# Patient Record
Sex: Female | Born: 2000 | Race: Black or African American | Hispanic: No | Marital: Single | Smoking: Never smoker
Health system: Southern US, Community
[De-identification: ages and names within clinical notes are randomized; demographics above are authoritative.]

## PROBLEM LIST (undated history)

## (undated) ENCOUNTER — Inpatient Hospital Stay (HOSPITAL_COMMUNITY): Payer: Self-pay

## (undated) DIAGNOSIS — Z9109 Other allergy status, other than to drugs and biological substances: Secondary | ICD-10-CM

## (undated) DIAGNOSIS — Z8759 Personal history of other complications of pregnancy, childbirth and the puerperium: Secondary | ICD-10-CM

## (undated) DIAGNOSIS — Z8619 Personal history of other infectious and parasitic diseases: Secondary | ICD-10-CM

## (undated) HISTORY — PX: NO PAST SURGERIES: SHX2092

---
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
## 2021-03-02 ENCOUNTER — Other Ambulatory Visit: Payer: Self-pay

## 2021-03-02 ENCOUNTER — Ambulatory Visit
Admission: EM | Admit: 2021-03-02 | Discharge: 2021-03-02 | Disposition: A | Discharge status: Home / Self Care | Payer: Medicaid Other | Attending: Emergency Medicine | Admitting: Emergency Medicine

## 2021-03-02 ENCOUNTER — Encounter: Payer: Self-pay | Admitting: Emergency Medicine

## 2021-03-02 DIAGNOSIS — J029 Acute pharyngitis, unspecified: Secondary | ICD-10-CM | POA: Diagnosis not present

## 2021-03-02 LAB — POCT RAPID STREP A (OFFICE): Rapid Strep A Screen: NEGATIVE

## 2021-03-02 NOTE — Discharge Instructions (Addendum)
Your rapid strep test is negative.    Take Tylenol or ibuprofen as needed for discomfort.    Follow-up with your primary care provider if your symptoms are not improving.       

## 2021-03-02 NOTE — ED Provider Notes (Signed)
Dorothy Wallace    CSN: [id number] Arrival date & time: 03/02/21  1331      History   Chief Complaint Chief Complaint  Patient presents with   Sore Throat    HPI Dorothy Wallace is a 20 y.o. female.  Patient presents with 62-month history of intermittent sore throat with hoarse voice.  She denies fever, chills, rash, cough, shortness of breath, or other symptoms.  No treatments attempted at home.  Her medical history includes seasonal allergies.  The history is provided by the patient and medical records.   Past Medical History:  Diagnosis Date   Environmental allergies    History of chlamydia    History of gonorrhea    History of miscarriage     There are no problems to display for this patient.   History reviewed. No pertinent surgical history.  OB History   No obstetric history on file.      Home Medications    Prior to Admission medications   Medication Sig Start Date End Date Taking? Authorizing Provider  metroNIDAZOLE (FLAGYL) 500 MG tablet Take 1 tablet (500 mg total) by mouth 2 (two) times daily. 09/29/20   Becky Augusta, NP    Family History Family History  Problem Relation Age of Onset   Stroke Mother    Multiple sclerosis Father     Social History Social History   Tobacco Use   Smoking status: Never   Smokeless tobacco: Never  Vaping Use   Vaping Use: Never used  Substance Use Topics   Alcohol use: Never   Drug use: Never     Allergies   Pollen extract   Review of Systems Review of Systems  Constitutional:  Negative for chills and fever.  HENT:  Positive for sore throat and voice change. Negative for ear pain and trouble swallowing.   Respiratory:  Negative for cough and shortness of breath.   Cardiovascular:  Negative for chest pain and palpitations.  Skin:  Negative for color change and rash.  All other systems reviewed and are negative.   Physical Exam Triage Vital Signs ED Triage Vitals  Enc Vitals Group     BP       Pulse      Resp      Temp      Temp src      SpO2      Weight      Height      Head Circumference      Peak Flow      Pain Score      Pain Loc      Pain Edu?      Excl. in GC?    No data found.  Updated Vital Signs BP 114/80   Pulse 75   Temp 98.5 F (36.9 C)   Resp 20   SpO2 99%   Visual Acuity Right Eye Distance:   Left Eye Distance:   Bilateral Distance:    Right Eye Near:   Left Eye Near:    Bilateral Near:     Physical Exam Vitals and nursing note reviewed.  Constitutional:      General: She is not in acute distress.    Appearance: She is well-developed. She is not ill-appearing.  HENT:     Head: Normocephalic and atraumatic.     Right Ear: Tympanic membrane normal.     Left Ear: Tympanic membrane normal.     Nose: Nose normal.     Mouth/Throat:  Mouth: Mucous membranes are moist.     Pharynx: Posterior oropharyngeal erythema present.  Eyes:     Conjunctiva/sclera: Conjunctivae normal.  Cardiovascular:     Rate and Rhythm: Normal rate and regular rhythm.     Heart sounds: Normal heart sounds.  Pulmonary:     Effort: Pulmonary effort is normal. No respiratory distress.     Breath sounds: Normal breath sounds.  Abdominal:     Palpations: Abdomen is soft.     Tenderness: There is no abdominal tenderness.  Musculoskeletal:     Cervical back: Neck supple.  Skin:    General: Skin is warm and dry.  Neurological:     General: No focal deficit present.     Mental Status: She is alert and oriented to person, place, and time.     Gait: Gait normal.  Psychiatric:        Mood and Affect: Mood normal.        Behavior: Behavior normal.     UC Treatments / Results  Labs (all labs ordered are listed, but only abnormal results are displayed) Labs Reviewed  POCT RAPID STREP A (OFFICE) - Normal    EKG   Radiology No results found.  Procedures Procedures (including critical care time)  Medications Ordered in UC Medications - No data to  display  Initial Impression / Assessment and Plan / UC Course  I have reviewed the triage vital signs and the nursing notes.  Pertinent labs & imaging results that were available during my care of the patient were reviewed by me and considered in my medical decision making (see chart for details).  Sore throat.  Rapid strep negative.  Symptomatic treatment.  Instructed patient to follow-up with her PCP if her symptoms are not improving.  Education provided on sore throats.  Patient agrees to plan of care.   Final Clinical Impressions(s) / UC Diagnoses   Final diagnoses:  Sore throat     Discharge Instructions      Your rapid strep test is negative.    Take Tylenol or ibuprofen as needed for discomfort.    Follow up with your primary care provider if your symptoms are not improving.         ED Prescriptions   None    PDMP not reviewed this encounter.   Mickie Bail, NP 03/02/21 1408

## 2021-03-02 NOTE — ED Triage Notes (Signed)
Pt here with sore throat and bloody sputum that has been on and off for many weeks.

## 2021-04-07 ENCOUNTER — Other Ambulatory Visit: Payer: Self-pay

## 2021-04-07 ENCOUNTER — Ambulatory Visit
Admission: EM | Admit: 2021-04-07 | Discharge: 2021-04-07 | Disposition: A | Discharge status: Home / Self Care | Payer: Medicaid Other | Attending: Internal Medicine | Admitting: Internal Medicine

## 2021-04-07 DIAGNOSIS — N760 Acute vaginitis: Secondary | ICD-10-CM | POA: Insufficient documentation

## 2021-04-07 LAB — WET PREP, GENITAL
Sperm: NONE SEEN
Trich, Wet Prep: NONE SEEN
WBC, Wet Prep HPF POC: NONE SEEN
Yeast Wet Prep HPF POC: NONE SEEN

## 2021-04-07 LAB — CHLAMYDIA/NGC RT PCR (ARMC ONLY)
Chlamydia Tr: NOT DETECTED
N gonorrhoeae: NOT DETECTED

## 2021-04-07 MED ORDER — FLUCONAZOLE 150 MG PO TABS: 150.0000 mg | ORAL_TABLET | Freq: Once | ORAL | 0 refills | Status: AC

## 2021-04-07 NOTE — Discharge Instructions (Addendum)
Avoid sexual intercourse until the lab results are available We will call you with recommendations if labs are abnormal Take medications as prescribed Return to urgent care if symptoms persist.

## 2021-04-07 NOTE — ED Triage Notes (Signed)
Pt c/o of excessive discharge that is white and thick, slight smell x 1 week.  Denies itching.

## 2021-04-07 NOTE — ED Provider Notes (Signed)
[facility]    CSN: [id number] Arrival date & time: 04/07/21  1055      History   Chief Complaint Chief Complaint  Patient presents with   Vaginal Discharge    HPI Dorothy Wallace is a 20 y.o. female comes to the urgent care with a 1 week history of thick white vaginal discharge with mild odor.  Symptoms started insidiously and has been persistent.  She denies any dysuria urgency or frequency.  No abdominal pain.  No fever or chills.  No deep dyspareunia.  Patient denies douching. HPI  Past Medical History:  Diagnosis Date   Environmental allergies    History of chlamydia    History of gonorrhea    History of miscarriage     There are no problems to display for this patient.   No past surgical history on file.  OB History   No obstetric history on file.      Home Medications    Prior to Admission medications   Medication Sig Start Date End Date Taking? Authorizing Provider  fluconazole (DIFLUCAN) 150 MG tablet Take 1 tablet (150 mg total) by mouth once for 1 dose. 04/07/21 04/07/21 Yes Caddie Randle, Britta Mccreedy, MD  metroNIDAZOLE (FLAGYL) 500 MG tablet Take 1 tablet (500 mg total) by mouth 2 (two) times daily. 09/29/20   Becky Augusta, NP    Family History Family History  Problem Relation Age of Onset   Stroke Mother    Multiple sclerosis Father     Social History Social History   Tobacco Use   Smoking status: Never   Smokeless tobacco: Never  Vaping Use   Vaping Use: Never used  Substance Use Topics   Alcohol use: Never   Drug use: Never     Allergies   Pollen extract   Review of Systems Review of Systems  Constitutional: Negative.   Gastrointestinal:  Negative for abdominal pain.  Genitourinary:  Positive for vaginal discharge. Negative for dysuria, frequency, urgency, vaginal bleeding and vaginal pain.    Physical Exam Triage Vital Signs ED Triage Vitals  Enc Vitals Group     BP 04/07/21 1126 102/76     Pulse Rate 04/07/21 1126  (!) 123     Resp 04/07/21 1126 16     Temp 04/07/21 1126 98.4 F (36.9 C)     Temp Source 04/07/21 1126 Oral     SpO2 04/07/21 1126 100 %     Weight 04/07/21 1124 230 lb (104.3 kg)     Height 04/07/21 1124 5\' 8"  (1.727 m)     Head Circumference --      Peak Flow --      Pain Score --      Pain Loc --      Pain Edu? --      Excl. in GC? --    No data found.  Updated Vital Signs BP 102/76 (BP Location: Left Arm)   Pulse (!) 123   Temp 98.4 F (36.9 C) (Oral)   Resp 16   Ht 5\' 8"  (1.727 m)   Wt 104.3 kg   LMP 03/16/2021 (Exact Date)   SpO2 100%   BMI 34.97 kg/m   Visual Acuity Right Eye Distance:   Left Eye Distance:   Bilateral Distance:    Right Eye Near:   Left Eye Near:    Bilateral Near:     Physical Exam Vitals and nursing note reviewed.  Cardiovascular:     Rate and Rhythm: Normal rate  and regular rhythm.  Pulmonary:     Effort: Pulmonary effort is normal.     Breath sounds: Normal breath sounds.  Abdominal:     General: Bowel sounds are normal.     Palpations: Abdomen is soft.  Neurological:     Mental Status: She is alert.     UC Treatments / Results  Labs (all labs ordered are listed, but only abnormal results are displayed) Labs Reviewed  CERVICOVAGINAL ANCILLARY ONLY    EKG   Radiology No results found.  Procedures Procedures (including critical care time)  Medications Ordered in UC Medications - No data to display  Initial Impression / Assessment and Plan / UC Course  I have reviewed the triage vital signs and the nursing notes.  Pertinent labs & imaging results that were available during my care of the patient were reviewed by me and considered in my medical decision making (see chart for details).     1.  Acute vaginitis likely vaginal yeast infection: Wet prep GC/chlamydia testing Fluconazole 150 mg x 2 doses We will call patient with recommendations if labs are abnormal Return to urgent care if symptoms persist. Avoid  sexual intercourse until lab results are available.  Final Clinical Impressions(s) / UC Diagnoses   Final diagnoses:  Acute vaginitis   Discharge Instructions   None    ED Prescriptions     Medication Sig Dispense Auth. Provider   fluconazole (DIFLUCAN) 150 MG tablet Take 1 tablet (150 mg total) by mouth once for 1 dose. 2 tablet Kateland Leisinger, Britta Mccreedy, MD      PDMP not reviewed this encounter.   Merrilee Jansky, MD 04/07/21 [id number]

## 2021-06-06 ENCOUNTER — Other Ambulatory Visit: Payer: Self-pay

## 2021-06-06 ENCOUNTER — Encounter (HOSPITAL_COMMUNITY): Payer: Self-pay | Admitting: Emergency Medicine

## 2021-06-06 ENCOUNTER — Ambulatory Visit (HOSPITAL_COMMUNITY)
Admission: EM | Admit: 2021-06-06 | Discharge: 2021-06-06 | Disposition: A | Discharge status: Home / Self Care | Payer: Medicaid Other | Attending: Sports Medicine | Admitting: Sports Medicine

## 2021-06-06 DIAGNOSIS — B950 Streptococcus, group A, as the cause of diseases classified elsewhere: Secondary | ICD-10-CM

## 2021-06-06 DIAGNOSIS — J020 Streptococcal pharyngitis: Secondary | ICD-10-CM | POA: Diagnosis not present

## 2021-06-06 LAB — POCT RAPID STREP A, ED / UC: Streptococcus, Group A Screen (Direct): POSITIVE — AB

## 2021-06-06 MED ORDER — PENICILLIN V POTASSIUM 500 MG PO TABS: 500.0000 mg | ORAL_TABLET | Freq: Two times a day (BID) | ORAL | 0 refills | Status: AC

## 2021-06-06 NOTE — ED Provider Notes (Signed)
[facility]    CSN: [id number] Arrival date & time: 06/06/21  1617      History   Chief Complaint Chief Complaint  Patient presents with   Sore Throat    HPI Dorothy Wallace is a 20 y.o. female here for sore throat.   Sore Throat Pertinent negatives include no headaches and no shortness of breath.   Sore throat x 4 days.  Temps at home: 101 > 100.6 F.  Meds: Is taking Tylenol BID at home for fevers and irritability. Nasal rhinorrhea Ear pain: No Painful to swallow. No ear pain, no headache. Reports some chest congestion that is mild, but her throat pain is the most significant discomfort. No allergy to penicillins. No known prior sick contacts.   CENTOR - cough: absent - tonsillar exudates: Yes - Nodes (lymphadenopathy ant cervical): Yes - Temp > 100.4 F: Yes at home- temp is 100 F here - Age: 20  Past Medical History:  Diagnosis Date   Environmental allergies    History of chlamydia    History of gonorrhea    History of miscarriage     There are no problems to display for this patient.   History reviewed. No pertinent surgical history.  OB History   No obstetric history on file.      Home Medications    Prior to Admission medications   Medication Sig Start Date End Date Taking? Authorizing Provider  penicillin v potassium (VEETID) 500 MG tablet Take 1 tablet (500 mg total) by mouth in the morning and at bedtime for 10 days. 06/06/21 06/16/21 Yes Madelyn Brunner, DO  metroNIDAZOLE (FLAGYL) 500 MG tablet Take 1 tablet (500 mg total) by mouth 2 (two) times daily. Patient not taking: Reported on 06/06/2021 09/29/20   Becky Augusta, NP    Family History Family History  Problem Relation Age of Onset   Stroke Mother    Multiple sclerosis Father     Social History Social History   Tobacco Use   Smoking status: Never   Smokeless tobacco: Never  Vaping Use   Vaping Use: Never used  Substance Use Topics   Alcohol use: Never   Drug use:  Never     Allergies   Pollen extract   Review of Systems Review of Systems  Constitutional:  Positive for appetite change, chills and fever.  HENT:  Positive for congestion, sore throat and trouble swallowing.   Eyes:  Negative for visual disturbance.  Respiratory:  Negative for cough, shortness of breath and wheezing.   Cardiovascular:        + upper chest/throat discomfort  Skin:  Negative for rash.  Neurological:  Negative for dizziness and headaches.    Physical Exam Triage Vital Signs ED Triage Vitals  Enc Vitals Group     BP 06/06/21 1700 124/73     Pulse Rate 06/06/21 1700 (!) 102     Resp 06/06/21 1700 (!) 22     Temp 06/06/21 1700 100 F (37.8 C)     Temp Source 06/06/21 1700 Oral     SpO2 06/06/21 1700 100 %     Weight --      Height --      Head Circumference --      Peak Flow --      Pain Score 06/06/21 1658 10     Pain Loc --      Pain Edu? --      Excl. in GC? --    No data  found.  Updated Vital Signs BP 124/73 (BP Location: Right Arm)    Pulse (!) 102    Temp 100 F (37.8 C) (Oral)    Resp (!) 22    LMP 05/24/2021    SpO2 100%    Physical Exam Constitutional:      General: She is not in acute distress.    Appearance: She is ill-appearing. She is not toxic-appearing.  HENT:     Head: Normocephalic and atraumatic.     Right Ear: Tympanic membrane and ear canal normal.     Left Ear: Tympanic membrane and ear canal normal.     Nose: No congestion.     Mouth/Throat:     Mouth: Mucous membranes are moist.     Pharynx: Posterior oropharyngeal erythema present.     Tonsils: Tonsillar exudate present. No tonsillar abscesses. 2+ on the right. 2+ on the left.  Eyes:     Conjunctiva/sclera: Conjunctivae normal.     Pupils: Pupils are equal, round, and reactive to light.  Cardiovascular:     Rate and Rhythm: Normal rate.     Heart sounds: Normal heart sounds.  Pulmonary:     Effort: Pulmonary effort is normal.     Breath sounds: Normal breath  sounds. No wheezing, rhonchi or rales.  Abdominal:     Palpations: Abdomen is soft.  Lymphadenopathy:     Cervical: Cervical adenopathy present.  Skin:    Capillary Refill: Capillary refill takes less than 2 seconds.  Neurological:     Mental Status: She is alert.  Psychiatric:        Mood and Affect: Mood normal.        Behavior: Behavior normal.     UC Treatments / Results  Labs (all labs ordered are listed, but only abnormal results are displayed) Labs Reviewed  POCT RAPID STREP A, ED / UC - Abnormal; Notable for the following components:      Result Value   Streptococcus, Group A Screen (Direct) POSITIVE (*)    All other components within normal limits    EKG   Radiology No results found.  Procedures Procedures (including critical care time)  Medications Ordered in UC Medications - No data to display  Initial Impression / Assessment and Plan / UC Course  I have reviewed the triage vital signs and the nursing notes.  Pertinent labs & imaging results that were available during my care of the patient were reviewed by me and considered in my medical decision making (see chart for details).     Group A strep pharyngitis -significant Centor criteria and in the setting of positive GAS testing in urgent care.  We will treat with penicillin V 500 mg twice daily x10 days.  Supportive treatment discussed for her throat pain.  Note provided for work to remain out over the next 48 hours until antibiotics kick in.  Return precautions provided.  Otherwise follow-up with PCP. Final Clinical Impressions(s) / UC Diagnoses   Final diagnoses:  Strep pharyngitis  Group A streptococcal infection   Discharge Instructions   None    ED Prescriptions     Medication Sig Dispense Auth. Provider   penicillin v potassium (VEETID) 500 MG tablet Take 1 tablet (500 mg total) by mouth in the morning and at bedtime for 10 days. 20 tablet Elba Barman, DO      PDMP not reviewed this  encounter.   Elba Barman, DO 06/06/21 1744

## 2021-06-06 NOTE — ED Triage Notes (Signed)
Sore throat for the last 4-5 days.  Very painful to eat.  Patient reports she has been running fevers intermittently

## 2021-06-06 NOTE — ED Notes (Signed)
Strep and flu swab in lab 

## 2021-07-08 DIAGNOSIS — N898 Other specified noninflammatory disorders of vagina: Secondary | ICD-10-CM

## 2021-07-08 DIAGNOSIS — N6452 Nipple discharge: Secondary | ICD-10-CM | POA: Insufficient documentation

## 2021-07-08 DIAGNOSIS — Z01419 Encounter for gynecological examination (general) (routine) without abnormal findings: Secondary | ICD-10-CM | POA: Insufficient documentation

## 2021-07-10 ENCOUNTER — Ambulatory Visit
Admission: EM | Admit: 2021-07-10 | Discharge: 2021-07-10 | Disposition: A | Discharge status: Home / Self Care | Payer: Medicaid Other | Attending: Family Medicine | Admitting: Family Medicine

## 2021-07-10 ENCOUNTER — Other Ambulatory Visit: Payer: Self-pay

## 2021-07-10 DIAGNOSIS — J029 Acute pharyngitis, unspecified: Secondary | ICD-10-CM | POA: Diagnosis not present

## 2021-07-10 DIAGNOSIS — J069 Acute upper respiratory infection, unspecified: Secondary | ICD-10-CM

## 2021-07-10 DIAGNOSIS — R6889 Other general symptoms and signs: Secondary | ICD-10-CM | POA: Diagnosis not present

## 2021-07-10 LAB — POCT RAPID STREP A (OFFICE): Rapid Strep A Screen: NEGATIVE

## 2021-07-10 LAB — POCT INFLUENZA A/B
Influenza A, POC: NEGATIVE
Influenza B, POC: NEGATIVE

## 2021-07-10 MED ORDER — AZITHROMYCIN 250 MG PO TABS: ORAL_TABLET | ORAL | 0 refills | Status: DC

## 2021-07-10 MED ORDER — PROMETHAZINE-DM 6.25-15 MG/5ML PO SYRP: 5.0000 mL | ORAL_SOLUTION | Freq: Three times a day (TID) | ORAL | 0 refills | Status: DC | PRN

## 2021-07-10 NOTE — ED Provider Notes (Signed)
[name]    CSN: [id number] Arrival date & time: 07/10/21  1112      History   Chief Complaint Chief Complaint  Patient presents with   Sore Throat    HPI Dorothy Wallace is a 21 y.o. female.   HPI Patient presents for evaluation of fever and sore throat x 3 days. She has been treating her symptoms with Dayquil, Nyquil and throat spray. She endorses nasal drainage although denies cough.  Past Medical History:  Diagnosis Date   Environmental allergies    History of chlamydia    History of gonorrhea    History of miscarriage     There are no problems to display for this patient.   History reviewed. No pertinent surgical history.  OB History   No obstetric history on file.      Home Medications    Prior to Admission medications   Medication Sig Start Date End Date Taking? Authorizing Provider  azithromycin (ZITHROMAX) 250 MG tablet Take 2 tabs PO x 1 dose, then 1 tab PO QD x 4 days 07/10/21  Yes Bing Neighbors, FNP  promethazine-dextromethorphan (PROMETHAZINE-DM) 6.25-15 MG/5ML syrup Take 5 mLs by mouth 3 (three) times daily as needed for cough. 07/10/21  Yes Bing Neighbors, FNP  metroNIDAZOLE (FLAGYL) 500 MG tablet Take 1 tablet (500 mg total) by mouth 2 (two) times daily. Patient not taking: Reported on 06/06/2021 09/29/20   Becky Augusta, NP    Family History Family History  Problem Relation Age of Onset   Stroke Mother    Multiple sclerosis Father     Social History Social History   Tobacco Use   Smoking status: Never   Smokeless tobacco: Never  Vaping Use   Vaping Use: Never used  Substance Use Topics   Alcohol use: Never   Drug use: Never     Allergies   Pollen extract   Review of Systems Review of Systems Pertinent negatives listed in HPI   Physical Exam Triage Vital Signs ED Triage Vitals  Enc Vitals Group     BP 07/10/21 1129 101/71     Pulse Rate 07/10/21 1129 (!) 117     Resp 07/10/21 1129 16     Temp  07/10/21 1129 100 F (37.8 C)     Temp Source 07/10/21 1129 Temporal     SpO2 07/10/21 1129 98 %     Weight --      Height --      Head Circumference --      Peak Flow --      Pain Score 07/10/21 1128 10     Pain Loc --      Pain Edu? --      Excl. in GC? --    No data found.  Updated Vital Signs BP 101/71 (BP Location: Left Arm)    Pulse (!) 117    Temp 100 F (37.8 C) (Temporal)    Resp 16    LMP 06/23/2021    SpO2 98%   Visual Acuity Right Eye Distance:   Left Eye Distance:   Bilateral Distance:    Right Eye Near:   Left Eye Near:    Bilateral Near:     Physical Exam Constitutional:      Appearance: She is well-developed.  HENT:     Head: Normocephalic and atraumatic.     Right Ear: Tympanic membrane normal.     Left Ear: Tympanic membrane normal.     Nose: Congestion  present.     Mouth/Throat:     Pharynx: Oropharyngeal exudate, posterior oropharyngeal erythema and uvula swelling present.     Tonsils: 3+ on the right. 3+ on the left.  Eyes:     Pupils: Pupils are equal, round, and reactive to light.  Cardiovascular:     Rate and Rhythm: Regular rhythm. Tachycardia present.  Musculoskeletal:     Cervical back: Normal range of motion.  Lymphadenopathy:     Cervical: Cervical adenopathy present.  Skin:    General: Skin is warm and dry.     Capillary Refill: Capillary refill takes less than 2 seconds.  Neurological:     General: No focal deficit present.     Mental Status: She is oriented to person, place, and time.  Psychiatric:        Behavior: Behavior is cooperative.      UC Treatments / Results  Labs (all labs ordered are listed, but only abnormal results are displayed) Labs Reviewed  POCT RAPID STREP A (OFFICE)  POCT INFLUENZA A/B    EKG   Radiology No results found.  Procedures Procedures (including critical care time)  Medications Ordered in UC Medications - No data to display  Initial Impression / Assessment and Plan / UC Course   I have reviewed the triage vital signs and the nursing notes.  Pertinent labs & imaging results that were available during my care of the patient were reviewed by me and considered in my medical decision making (see chart for details).    Rapid flu and strep , both negative. Treating for what appears to be a bacterial pharyngitis, mostly strep  appearance of throat. Treatment prescribed and recommendation per discharge medication orders. RTC PRN Final Clinical Impressions(s) / UC Diagnoses   Final diagnoses:  Acute pharyngitis, unspecified etiology  Viral URI with cough     Discharge Instructions      Rapid flu and rapid strep are both negative. Treating you for a suspected throat infection and remainder of your symptoms appear viral. Continue Tylenol and or Ibuprofen for fever and bodyaches, if symptoms worsen or do not improve return for evaluation     ED Prescriptions     Medication Sig Dispense Auth. Provider   azithromycin (ZITHROMAX) 250 MG tablet Take 2 tabs PO x 1 dose, then 1 tab PO QD x 4 days 6 tablet Bing Neighbors, FNP   promethazine-dextromethorphan (PROMETHAZINE-DM) 6.25-15 MG/5ML syrup Take 5 mLs by mouth 3 (three) times daily as needed for cough. 140 mL Bing Neighbors, FNP      PDMP not reviewed this encounter.   Bing Neighbors, [place] 07/13/21 [phone]

## 2021-07-10 NOTE — ED Triage Notes (Signed)
Patient presents to Urgent Care with complaints of fever and sore throat x 3 days. Treating symptoms with dayquil, throat spray, and halls.

## 2021-07-10 NOTE — Discharge Instructions (Addendum)
Rapid flu and rapid strep are both negative. Treating you for a suspected throat infection and remainder of your symptoms appear viral. Continue Tylenol and or Ibuprofen for fever and bodyaches, if symptoms worsen or do not improve return for evaluation

## 2021-07-15 ENCOUNTER — Other Ambulatory Visit: Payer: Self-pay

## 2021-07-15 ENCOUNTER — Ambulatory Visit
Admission: EM | Admit: 2021-07-15 | Discharge: 2021-07-15 | Disposition: A | Discharge status: Home / Self Care | Payer: Medicaid Other | Attending: Emergency Medicine | Admitting: Emergency Medicine

## 2021-07-15 ENCOUNTER — Encounter: Payer: Self-pay | Admitting: Emergency Medicine

## 2021-07-15 DIAGNOSIS — J029 Acute pharyngitis, unspecified: Secondary | ICD-10-CM | POA: Diagnosis not present

## 2021-07-15 MED ORDER — PREDNISONE 10 MG (21) PO TBPK: ORAL_TABLET | Freq: Every day | ORAL | 0 refills | Status: DC

## 2021-07-15 NOTE — ED Provider Notes (Signed)
[name]    CSN: [id number] Arrival date & time: 07/15/21  1535      History   Chief Complaint Chief Complaint  Patient presents with   Sore Throat    HPI Dorothy Wallace is a 21 y.o. female.  Patient presents with sore throat x 1 week.  She states she tested positive for chlamydia in her throat and was treated by her OB/GYN for this.  She contacted her OB/GYN today for ongoing sore throat and was instructed to go to the ED.  She denies fever, rash, or other symptoms.  Patient was seen at this urgent care on 07/10/2021; diagnosed with acute pharyngitis and viral URI with cough; treated with Zithromax and Promethazine DM.  She was seen by her OB/GYN on 07/08/2021 for vaginal discharge; positive for chlamydia; treated with metronidazole and 1 gram of azithromycin.  She was seen at this urgent care on 06/06/2021; diagnosed with strep pharyngitis; treated with penicillin V.    The history is provided by the patient and medical records.   Past Medical History:  Diagnosis Date   Environmental allergies    History of chlamydia    History of gonorrhea    History of miscarriage     There are no problems to display for this patient.   History reviewed. No pertinent surgical history.  OB History   No obstetric history on file.      Home Medications    Prior to Admission medications   Medication Sig Start Date End Date Taking? Authorizing Provider  predniSONE (STERAPRED UNI-PAK 21 TAB) 10 MG (21) TBPK tablet Take by mouth daily. As directed 07/15/21  Yes Mickie Bail, NP  azithromycin (ZITHROMAX) 250 MG tablet Take 2 tabs PO x 1 dose, then 1 tab PO QD x 4 days 07/10/21   Bing Neighbors, FNP  metroNIDAZOLE (FLAGYL) 500 MG tablet Take 1 tablet (500 mg total) by mouth 2 (two) times daily. Patient not taking: Reported on 06/06/2021 09/29/20   Becky Augusta, NP  promethazine-dextromethorphan (PROMETHAZINE-DM) 6.25-15 MG/5ML syrup Take 5 mLs by mouth 3 (three) times daily as  needed for cough. 07/10/21   Bing Neighbors, FNP    Family History Family History  Problem Relation Age of Onset   Stroke Mother    Multiple sclerosis Father     Social History Social History   Tobacco Use   Smoking status: Never   Smokeless tobacco: Never  Vaping Use   Vaping Use: Never used  Substance Use Topics   Alcohol use: Never   Drug use: Never     Allergies   Pollen extract   Review of Systems Review of Systems  Constitutional:  Negative for chills and fever.  HENT:  Positive for sore throat. Negative for ear pain.   Respiratory:  Negative for cough and shortness of breath.   Cardiovascular:  Negative for chest pain and palpitations.  Skin:  Negative for color change and rash.  All other systems reviewed and are negative.   Physical Exam Triage Vital Signs ED Triage Vitals  Enc Vitals Group     BP 07/15/21 1550 121/75     Pulse Rate 07/15/21 1550 (!) 105     Resp 07/15/21 1550 18     Temp 07/15/21 1550 97.9 F (36.6 C)     Temp Source 07/15/21 1550 Oral     SpO2 07/15/21 1550 99 %     Weight --      Height --  Head Circumference --      Peak Flow --      Pain Score 07/15/21 1553 8     Pain Loc --      Pain Edu? --      Excl. in GC? --    No data found.  Updated Vital Signs BP 121/75 (BP Location: Left Arm)    Pulse (!) 105    Temp 97.9 F (36.6 C) (Oral)    Resp 18    LMP 06/23/2021    SpO2 99%   Visual Acuity Right Eye Distance:   Left Eye Distance:   Bilateral Distance:    Right Eye Near:   Left Eye Near:    Bilateral Near:     Physical Exam Vitals and nursing note reviewed.  Constitutional:      General: She is not in acute distress.    Appearance: She is well-developed. She is not ill-appearing.  HENT:     Head: Normocephalic and atraumatic.     Right Ear: Tympanic membrane normal.     Left Ear: Tympanic membrane normal.     Nose: Nose normal.     Mouth/Throat:     Mouth: Mucous membranes are moist.     Pharynx:  Posterior oropharyngeal erythema present.     Tonsils: 1+ on the right. 1+ on the left.     Comments: Speech clear. No difficulty swallowing.  Cardiovascular:     Rate and Rhythm: Normal rate and regular rhythm.     Heart sounds: Normal heart sounds.  Pulmonary:     Effort: Pulmonary effort is normal. No respiratory distress.     Breath sounds: Normal breath sounds.  Musculoskeletal:     Cervical back: Neck supple.  Skin:    General: Skin is warm and dry.  Neurological:     Mental Status: She is alert.  Psychiatric:        Mood and Affect: Mood normal.        Behavior: Behavior normal.     UC Treatments / Results  Labs (all labs ordered are listed, but only abnormal results are displayed) Labs Reviewed - No data to display   EKG   Radiology No results found.  Procedures Procedures (including critical care time)  Medications Ordered in UC Medications - No data to display  Initial Impression / Assessment and Plan / UC Course  I have reviewed the triage vital signs and the nursing notes.  Pertinent labs & imaging results that were available during my care of the patient were reviewed by me and considered in my medical decision making (see chart for details).    Sore throat. Patient declines strep test. She declines STD testing of throat.  She was treated with Zithromax earlier this week.  Treating today with prednisone.  Discussed Tylenol or ibuprofen as needed. Instructed her to follow up with her PCP or an ENT if her symptoms are not improving.  She agrees to plan of care.   Final Clinical Impressions(s) / UC Diagnoses   Final diagnoses:  Sore throat     Discharge Instructions      Take the prednisone as directed.   Take Tylenol or ibuprofen as needed for discomfort.  Follow up with your primary care provider if your symptoms are not improving.         ED Prescriptions     Medication Sig Dispense Auth. Provider   predniSONE (STERAPRED UNI-PAK 21  TAB) 10 MG (21) TBPK tablet Take by mouth  daily. As directed 21 tablet Mickie Bail, NP      PDMP not reviewed this encounter.   Mickie Bail, NP 07/15/21 1627

## 2021-07-15 NOTE — ED Triage Notes (Signed)
Pt here with sore throat x 1 week. She is Chlamydia and consulted with her GYN today who stated she should be seen in person to be reevaluated.

## 2021-07-15 NOTE — Discharge Instructions (Addendum)
Take the prednisone as directed.    Take Tylenol or ibuprofen as needed for discomfort.    Follow up with your primary care provider if your symptoms are not improving.     

## 2021-08-03 ENCOUNTER — Ambulatory Visit
Admission: EM | Admit: 2021-08-03 | Discharge: 2021-08-03 | Disposition: A | Discharge status: Home / Self Care | Payer: Medicaid Other | Attending: Emergency Medicine | Admitting: Emergency Medicine

## 2021-08-03 ENCOUNTER — Encounter: Payer: Self-pay | Admitting: Emergency Medicine

## 2021-08-03 ENCOUNTER — Other Ambulatory Visit: Payer: Self-pay

## 2021-08-03 DIAGNOSIS — Z3A01 Less than 8 weeks gestation of pregnancy: Secondary | ICD-10-CM

## 2021-08-03 DIAGNOSIS — Z3201 Encounter for pregnancy test, result positive: Secondary | ICD-10-CM

## 2021-08-03 LAB — POCT URINE PREGNANCY: Preg Test, Ur: POSITIVE — AB

## 2021-08-03 NOTE — Discharge Instructions (Addendum)
Your pregnancy test is positive.  Based on your last menstrual cycle, you are approximately 5 or [redacted] weeks pregnant.  Start prenatal vitamins today.  Schedule an appointment with your obstetrician.

## 2021-08-03 NOTE — ED Triage Notes (Signed)
Pt presents for a pregnancy test LMP 06/23/2021

## 2021-08-03 NOTE — ED Provider Notes (Signed)
Roderic Palau    CSN: [id number] Arrival date & time: 08/03/21  [id number]      History   Chief Complaint Chief Complaint  Patient presents with   Possible Pregnancy    HPI Dorothy Wallace is a 21 y.o. female.  Patient presents with request for a pregnancy test.  LMP 06/23/2021.  She took a pregnancy test at home and it was positive.  She denies abdominal pain, dysuria, hematuria, vaginal discharge, pelvic pain, fever, or other symptoms.  Her medical history includes gonorrhea, chlamydia, miscarriage, environmental allergies.  The history is provided by the patient and medical records.   Past Medical History:  Diagnosis Date   Environmental allergies    History of chlamydia    History of gonorrhea    History of miscarriage     There are no problems to display for this patient.   History reviewed. No pertinent surgical history.  OB History   No obstetric history on file.      Home Medications    Prior to Admission medications   Medication Sig Start Date End Date Taking? Authorizing Provider  azithromycin (ZITHROMAX) 250 MG tablet Take 2 tabs PO x 1 dose, then 1 tab PO QD x 4 days 07/10/21   Scot Jun, FNP  metroNIDAZOLE (FLAGYL) 500 MG tablet Take 1 tablet (500 mg total) by mouth 2 (two) times daily. Patient not taking: Reported on 06/06/2021 09/29/20   Margarette Canada, NP  predniSONE (STERAPRED UNI-PAK 21 TAB) 10 MG (21) TBPK tablet Take by mouth daily. As directed 07/15/21   Sharion Balloon, NP  promethazine-dextromethorphan (PROMETHAZINE-DM) 6.25-15 MG/5ML syrup Take 5 mLs by mouth 3 (three) times daily as needed for cough. 07/10/21   Scot Jun, FNP    Family History Family History  Problem Relation Age of Onset   Stroke Mother    Multiple sclerosis Father     Social History Social History   Tobacco Use   Smoking status: Never   Smokeless tobacco: Never  Vaping Use   Vaping Use: Never used  Substance Use Topics   Alcohol use: Never   Drug  use: Never     Allergies   Pollen extract   Review of Systems Review of Systems  Constitutional:  Negative for chills and fever.  Respiratory:  Negative for cough and shortness of breath.   Cardiovascular:  Negative for chest pain and palpitations.  Gastrointestinal:  Negative for abdominal pain, nausea and vomiting.  Genitourinary:  Negative for dysuria, flank pain, hematuria, pelvic pain and vaginal discharge.  Skin:  Negative for color change and rash.  All other systems reviewed and are negative.   Physical Exam Triage Vital Signs ED Triage Vitals  Enc Vitals Group     BP 08/03/21 0936 124/79     Pulse Rate 08/03/21 0936 79     Resp 08/03/21 0936 18     Temp 08/03/21 0936 98.6 F (37 C)     Temp src --      SpO2 08/03/21 0936 99 %     Weight --      Height --      Head Circumference --      Peak Flow --      Pain Score 08/03/21 0927 0     Pain Loc --      Pain Edu? --      Excl. in [place]? --    No data found.  Updated Vital Signs BP 124/79  Pulse 79    Temp 98.6 F (37 C)    Resp 18    LMP 06/23/2021    SpO2 99%   Visual Acuity Right Eye Distance:   Left Eye Distance:   Bilateral Distance:    Right Eye Near:   Left Eye Near:    Bilateral Near:     Physical Exam Vitals and nursing note reviewed.  Constitutional:      General: She is not in acute distress.    Appearance: She is well-developed. She is not ill-appearing.  HENT:     Mouth/Throat:     Mouth: Mucous membranes are moist.  Cardiovascular:     Rate and Rhythm: Normal rate and regular rhythm.     Heart sounds: Normal heart sounds.  Pulmonary:     Effort: Pulmonary effort is normal. No respiratory distress.     Breath sounds: Normal breath sounds.  Abdominal:     Palpations: Abdomen is soft.     Tenderness: There is no abdominal tenderness. There is no right CVA tenderness, left CVA tenderness, guarding or rebound.  Musculoskeletal:     Cervical back: Neck supple.  Skin:    General:  Skin is warm and dry.  Neurological:     Mental Status: She is alert.  Psychiatric:        Mood and Affect: Mood normal.        Behavior: Behavior normal.     UC Treatments / Results  Labs (all labs ordered are listed, but only abnormal results are displayed) Labs Reviewed  POCT URINE PREGNANCY - Abnormal; Notable for the following components:      Result Value   Preg Test, Ur Positive (*)    All other components within normal limits    EKG   Radiology No results found.  Procedures Procedures (including critical care time)  Medications Ordered in UC Medications - No data to display  Initial Impression / Assessment and Plan / UC Course  I have reviewed the triage vital signs and the nursing notes.  Pertinent labs & imaging results that were available during my care of the patient were reviewed by me and considered in my medical decision making (see chart for details).   Positive pregnancy test, Less than [redacted] weeks pregnant.  LMP 06/23/2021.  Instructed patient to start prenatal vitamins today.  Instructed patient to schedule an appointment with her obstetrician.  Instructed her to avoid OTC medications unless instructed by her obstetrician.  Education provided on first trimester pregnancy.  Patient has already contacted her OB/GYN at [facility] for an appointment.  She agrees to plan of care.   Final Clinical Impressions(s) / UC Diagnoses   Final diagnoses:  Positive pregnancy test  Less than [redacted] weeks gestation of pregnancy     Discharge Instructions      Your pregnancy test is positive.  Based on your last menstrual cycle, you are approximately 5 or [redacted] weeks pregnant.  Start prenatal vitamins today.  Schedule an appointment with your obstetrician.        ED Prescriptions   None    PDMP not reviewed this encounter.   Sharion Balloon, NP 08/03/21 1008

## 2021-08-29 ENCOUNTER — Encounter: Payer: Self-pay | Admitting: Emergency Medicine

## 2021-08-29 ENCOUNTER — Emergency Department: Payer: Medicaid Other

## 2021-08-29 ENCOUNTER — Telehealth: Payer: Self-pay

## 2021-08-29 ENCOUNTER — Other Ambulatory Visit: Payer: Self-pay

## 2021-08-29 ENCOUNTER — Emergency Department
Admission: EM | Admit: 2021-08-29 | Discharge: 2021-08-29 | Disposition: A | Discharge status: Home / Self Care | Payer: Medicaid Other | Attending: Emergency Medicine | Admitting: Emergency Medicine

## 2021-08-29 DIAGNOSIS — O99111 Other diseases of the blood and blood-forming organs and certain disorders involving the immune mechanism complicating pregnancy, first trimester: Secondary | ICD-10-CM | POA: Insufficient documentation

## 2021-08-29 DIAGNOSIS — O26899 Other specified pregnancy related conditions, unspecified trimester: Secondary | ICD-10-CM

## 2021-08-29 DIAGNOSIS — Z6720 Type B blood, Rh positive: Secondary | ICD-10-CM | POA: Insufficient documentation

## 2021-08-29 DIAGNOSIS — O039 Complete or unspecified spontaneous abortion without complication: Secondary | ICD-10-CM | POA: Diagnosis not present

## 2021-08-29 DIAGNOSIS — D72819 Decreased white blood cell count, unspecified: Secondary | ICD-10-CM | POA: Diagnosis not present

## 2021-08-29 DIAGNOSIS — Z3A08 8 weeks gestation of pregnancy: Secondary | ICD-10-CM | POA: Insufficient documentation

## 2021-08-29 DIAGNOSIS — O99011 Anemia complicating pregnancy, first trimester: Secondary | ICD-10-CM | POA: Diagnosis not present

## 2021-08-29 DIAGNOSIS — O26891 Other specified pregnancy related conditions, first trimester: Secondary | ICD-10-CM | POA: Diagnosis present

## 2021-08-29 LAB — BASIC METABOLIC PANEL
Anion gap: 6 (ref 5–15)
BUN: 8 mg/dL (ref 6–20)
CO2: 22 mmol/L (ref 22–32)
Calcium: 9 mg/dL (ref 8.9–10.3)
Chloride: 106 mmol/L (ref 98–111)
Creatinine, Ser: 0.54 mg/dL (ref 0.44–1.00)
GFR, Estimated: >60 mL/min (ref >60)
Glucose, Bld: 87 mg/dL (ref 70–99)
Potassium: 3.6 mmol/L (ref 3.5–5.1)
Sodium: 134 mmol/L — ABNORMAL LOW (ref 135–145)

## 2021-08-29 LAB — CBC
HCT: 36.5 % (ref 36.0–46.0)
Hemoglobin: 11.7 g/dL — ABNORMAL LOW (ref 12.0–15.0)
MCH: 25.6 pg — ABNORMAL LOW (ref 26.0–34.0)
MCHC: 32.1 g/dL (ref 30.0–36.0)
MCV: 79.9 fL — ABNORMAL LOW (ref 80.0–100.0)
Platelets: 259 10*3/uL (ref 150–400)
RBC: 4.57 MIL/uL (ref 3.87–5.11)
RDW: 18.2 % — ABNORMAL HIGH (ref 11.5–15.5)
WBC: 3 10*3/uL — ABNORMAL LOW (ref 4.0–10.5)
nRBC: 0 % (ref 0.0–0.2)

## 2021-08-29 LAB — POC URINE PREG, ED: Preg Test, Ur: POSITIVE — AB

## 2021-08-29 LAB — HCG, QUANTITATIVE, PREGNANCY: hCG, Beta Chain, Quant, S: 26937 m[IU]/mL — ABNORMAL HIGH (ref <5)

## 2021-08-29 LAB — ABO/RH: ABO/RH(D): B POS

## 2021-08-29 NOTE — Discharge Instructions (Signed)
Follow-up with Dr. Cherylynn Ridges or Dr. Tiburcio Pea.  Please call them for an appointment.  He needs to see you within the next 48 hours.  Return emergency department worsening ?

## 2021-08-29 NOTE — ED Provider Notes (Signed)
? ?[facility] ?Provider Note ? ? ? Event Date/Time  ? First MD Initiated Contact with Patient 08/29/21 1149   ?  (approximate) ? ? ?History  ? ?Abdominal Cramping ? ? ?HPI ? ?Dorothy Wallace is a 21 y.o. female G2, P0 presents with complaints of abdominal cramping in pregnancy.  Patient thinks she is approximately [redacted] weeks pregnant.  Started having a large amount of cramping and some clear discharge.  Concerned as she has had a previous miscarriage.  No fever or chills.  No color to the discharge.  No bleeding. ? ?  ? ? ?Physical Exam  ? ?Triage Vital Signs: ?ED Triage Vitals  ?Enc Vitals Group  ?   BP 08/29/21 1125 119/79  ?   Pulse Rate 08/29/21 1125 85  ?   Resp 08/29/21 1125 16  ?   Temp 08/29/21 1125 98.2 ?F (36.8 ?C)  ?   Temp Source 08/29/21 1125 Oral  ?   SpO2 08/29/21 1125 100 %  ?   Weight 08/29/21 1126 217 lb (98.4 kg)  ?   Height 08/29/21 1126 5\' 8"  (1.727 m)  ?   Head Circumference --   ?   Peak Flow --   ?   Pain Score 08/29/21 1126 8  ?   Pain Loc --   ?   Pain Edu? --   ?   Excl. in [place]? --   ? ? ?Most recent vital signs: ?Vitals:  ? 08/29/21 1125 08/29/21 1318  ?BP: 119/79 120/70  ?Pulse: 85 80  ?Resp: 16 16  ?Temp: 98.2 ?F (36.8 ?C)   ?SpO2: 100% 100%  ? ? ? ?General: Awake, no distress.   ?CV:  Good peripheral perfusion. regular rate and  rhythm ?Resp:  Normal effort. Lungs CTA ?Abd:  No distention.  Nontender ?Other:    ? ? ?ED Results / Procedures / Treatments  ? ?Labs ?(all labs ordered are listed, but only abnormal results are displayed) ?Labs Reviewed  ?CBC - Abnormal; Notable for the following components:  ?    Result Value  ? WBC 3.0 (*)   ? Hemoglobin 11.7 (*)   ? MCV 79.9 (*)   ? MCH 25.6 (*)   ? RDW 18.2 (*)   ? All other components within normal limits  ?BASIC METABOLIC PANEL - Abnormal; Notable for the following components:  ? Sodium 134 (*)   ? All other components within normal limits  ?HCG, QUANTITATIVE, PREGNANCY - Abnormal; Notable for the following components:   ? hCG, Beta Neomia Dear 26,937 (*)   ? All other components within normal limits  ?POC URINE PREG, ED - Abnormal; Notable for the following components:  ? Preg Test, Ur Positive (*)   ? All other components within normal limits  ?ABO/RH  ? ? ? ?EKG ? ? ? ? ?RADIOLOGY ?Ultrasound OB less than 14 weeks ? ? ? ?PROCEDURES: ? ? ?Procedures ? ? ?MEDICATIONS ORDERED IN ED: ?Medications - No data to display ? ? ?IMPRESSION / MDM / ASSESSMENT AND PLAN / ED COURSE  ?I reviewed the triage vital signs and the nursing notes. ?             ?               ? ?Differential diagnosis includes, but is not limited to, threatened miscarriage, miscarriage, ectopic ? ?Labs are reassuring, he has a pregnancy is positive, beta-hCG is at 26,937 which may be a little low for  10 weeks.  ABO/Rh is B+ so patient will not need RhoGAM.  Basic metabolic panel is normal.  CBC does have a depressed WBC of 3, hemoglobin is a little low at 11.7 ? ?Ultrasound OB less than 14 weeks was independently reviewed by me and does show a IUP but no heartbeat.  Radiologist is reading as failed pregnancy.  He sees no heartbeat.  States findings meet criteria for failed pregnancy. ? ?I did explain these findings to the patient. ?Consult to GYN, Dr. Kenton Kingfisher.  Would like for her to follow-up in his office in 24 to 48 hours. ? ?I did explain this to the patient.  She is to call make an appointment.  Return emergency department if worsening.  She was discharged in stable condition. ? ? ? ? ?  ? ? ?FINAL CLINICAL IMPRESSION(S) / ED DIAGNOSES  ? ?Final diagnoses:  ?Miscarriage  ? ? ? ?Rx / DC Orders  ? ?ED Discharge Orders   ? ? None  ? ?  ? ? ? ?Note:  This document was prepared using Dragon voice recognition software and may include unintentional dictation errors. ? ?  ?Versie Starks, PA-C ?08/29/21 1510 ? ?  ?Lavonia Drafts, MD ?08/29/21 1512 ? ?

## 2021-08-29 NOTE — Telephone Encounter (Signed)
Patient calling to scheduled ER Follow up. I attempt to reach patient via phone. Left voicemail for patient to call back to be scheduled.

## 2021-08-29 NOTE — ED Triage Notes (Signed)
Pt here with abd cramping that started yesterday. Pt is currently [redacted] wks pregnant. Pt states discharge as well. Pt stable in triage. ?

## 2021-08-30 NOTE — Telephone Encounter (Signed)
Called and left voicemail for patient to call back to be scheduled. 

## 2021-09-13 ENCOUNTER — Other Ambulatory Visit: Payer: Self-pay

## 2021-09-13 ENCOUNTER — Ambulatory Visit
Admission: EM | Admit: 2021-09-13 | Discharge: 2021-09-13 | Disposition: A | Discharge status: Home / Self Care | Payer: Medicaid Other | Attending: Emergency Medicine | Admitting: Emergency Medicine

## 2021-09-13 DIAGNOSIS — N760 Acute vaginitis: Secondary | ICD-10-CM | POA: Insufficient documentation

## 2021-09-13 DIAGNOSIS — N898 Other specified noninflammatory disorders of vagina: Secondary | ICD-10-CM

## 2021-09-13 DIAGNOSIS — B9689 Other specified bacterial agents as the cause of diseases classified elsewhere: Secondary | ICD-10-CM | POA: Diagnosis not present

## 2021-09-13 LAB — WET PREP, GENITAL
Sperm: NONE SEEN
Trich, Wet Prep: NONE SEEN
WBC, Wet Prep HPF POC: <10 (ref <10)
Yeast Wet Prep HPF POC: NONE SEEN

## 2021-09-13 MED ORDER — METRONIDAZOLE 500 MG PO TABS: 500.0000 mg | ORAL_TABLET | Freq: Two times a day (BID) | ORAL | 0 refills | Status: AC

## 2021-09-13 NOTE — ED Provider Notes (Signed)
?[facility] ? ? ? ?CSN: [id number] ?Arrival date & time: 09/13/21  1612 ? ? ?  ? ?History   ?Chief Complaint ?Chief Complaint  ?Patient presents with  ? Vaginal Discharge  ? ? ?HPI ?Dorothy Wallace is a 21 y.o. female.  ? ?21 year old female pt, Dorothy Wallace, presents to ER with chief complaint of  vaginal discharge after taking cytotec on 09/08/2021, pt states she finished bleeding and passing tissue on 09/10/2021 but has vaginal irritation "I think I have a yeast infection", pt denies recent sexual activity and has follow up [facility] with OB for recheck. Pt voided prior to arrival unable to give urine specimen, will obtain wet prep and have pt follow [facility] for further evaluation as scheduled ? ?The history is provided by the patient. No language interpreter was used.  ? ?Past Medical History:  ?Diagnosis Date  ? Environmental allergies   ? History of chlamydia   ? History of gonorrhea   ? History of miscarriage   ? ? ?Patient Active Problem List  ? Diagnosis Date Noted  ? BV (bacterial vaginosis) 09/13/2021  ? Vaginal discharge 09/13/2021  ? ? ?History reviewed. No pertinent surgical history. ? ?OB History   ?No obstetric history on file. ?  ? ? ? ?Home Medications   ? ?Prior to Admission medications   ?Medication Sig Start Date End Date Taking? Authorizing Provider  ?metroNIDAZOLE (FLAGYL) 500 MG tablet Take 1 tablet (500 mg total) by mouth 2 (two) times daily for 7 days. 09/13/21 09/20/21 Yes Jhan Conery, Para MarchJeanette, NP  ?promethazine-dextromethorphan (PROMETHAZINE-DM) 6.25-15 MG/5ML syrup Take 5 mLs by mouth 3 (three) times daily as needed for cough. 07/10/21   Bing NeighborsHarris, Kimberly S, FNP  ? ? ?Family History ?Family History  ?Problem Relation Age of Onset  ? Stroke Mother   ? Multiple sclerosis Father   ? ? ?Social History ?Social History  ? ?Tobacco Use  ? Smoking status: Never  ? Smokeless tobacco: Never  ?Vaping Use  ? Vaping Use: Never used  ?Substance Use Topics  ? Alcohol use:  Never  ? Drug use: Never  ? ? ? ?Allergies   ?Pollen extract ? ? ?Review of Systems ?Review of Systems  ?Constitutional:  Negative for fever.  ?Gastrointestinal:  Negative for abdominal pain, nausea and vomiting.  ?Genitourinary:  Positive for vaginal discharge.  ?All other systems reviewed and are negative. ? ? ?Physical Exam ?Triage Vital Signs ?ED Triage Vitals  ?Enc Vitals Group  ?   BP 09/13/21 1626 129/78  ?   Pulse Rate 09/13/21 1626 72  ?   Resp 09/13/21 1626 18  ?   Temp 09/13/21 1626 98.7 ?F (37.1 ?C)  ?   Temp Source 09/13/21 1626 Oral  ?   SpO2 09/13/21 1626 100 %  ?   Weight 09/13/21 1623 217 lb (98.4 kg)  ?   Height 09/13/21 1623 5\' 9"  (1.753 m)  ?   Head Circumference --   ?   Peak Flow --   ?   Pain Score 09/13/21 1623 0  ?   Pain Loc --   ?   Pain Edu? --   ?   Excl. in [place]? --   ? ?No data found. ? ?Updated Vital Signs ?BP 129/78 (BP Location: Left Arm)   Pulse 72   Temp 98.7 ?F (37.1 ?C) (Oral)   Resp 18   Ht 5\' 9"  (1.753 m)   Wt 217  lb (98.4 kg)   LMP  (LMP Unknown)   SpO2 100%   BMI 32.05 kg/m?  ? ?Visual Acuity ?Right Eye Distance:   ?Left Eye Distance:   ?Bilateral Distance:   ? ?Right Eye Near:   ?Left Eye Near:    ?Bilateral Near:    ? ?Physical Exam ?Vitals and nursing note reviewed.  ?Constitutional:   ?   General: She is not in acute distress. ?   Appearance: She is well-developed and well-groomed.  ?HENT:  ?   Head: Normocephalic and atraumatic.  ?Eyes:  ?   General: Lids are normal.  ?   Extraocular Movements: Extraocular movements intact.  ?   Conjunctiva/sclera: Conjunctivae normal.  ?   Pupils: Pupils are equal, round, and reactive to light.  ?Neck:  ?   Trachea: Trachea normal.  ?Cardiovascular:  ?   Rate and Rhythm: Normal rate and regular rhythm.  ?   Pulses: Normal pulses.  ?   Heart sounds: Normal heart sounds. No murmur heard. ?Pulmonary:  ?   Effort: Pulmonary effort is normal. No respiratory distress.  ?   Breath sounds: Normal breath sounds and air entry.  ?Abdominal:   ?   Tenderness: There is no abdominal tenderness.  ?Musculoskeletal:     ?   General: No swelling.  ?   Cervical back: Normal range of motion and neck supple.  ?Skin: ?   General: Skin is warm and dry.  ?   Capillary Refill: Capillary refill takes less than 2 seconds.  ?Neurological:  ?   General: No focal deficit present.  ?   Mental Status: She is alert and oriented to person, place, and time.  ?   GCS: GCS eye subscore is 4. GCS verbal subscore is 5. GCS motor subscore is 6.  ?Psychiatric:     ?   Attention and Perception: Attention normal.     ?   Mood and Affect: Mood normal.     ?   Speech: Speech normal.     ?   Behavior: Behavior normal. Behavior is cooperative.  ? ? ? ?UC Treatments / Results  ?Labs ?(all labs ordered are listed, but only abnormal results are displayed) ?Labs Reviewed  ?WET PREP, GENITAL - Abnormal; Notable for the following components:  ?    Result Value  ? Clue Cells Wet Prep HPF POC PRESENT (*)   ? All other components within normal limits  ?CERVICOVAGINAL ANCILLARY ONLY  ? ? ?EKG ? ? ?Radiology ?No results found. ? ?Procedures ?Procedures (including critical care time) ? ?Medications Ordered in UC ?Medications - No data to display ? ?Initial Impression / Assessment and Plan / UC Course  ?I have reviewed the triage vital signs and the nursing notes. ? ?Pertinent labs & imaging results that were available during my care of the patient were reviewed by me and considered in my medical decision making (see chart for details). ? ?  ? ?Ddx: BV ,vaginal irritation from recent miscarriage, STI ?Final Clinical Impressions(s) / UC Diagnoses  ? ?Final diagnoses:  ?BV (bacterial vaginosis)  ?Vaginal discharge  ? ? ? ?Discharge Instructions   ? ?  ?Avoid sexual activity, douching, tampon use, wear pads if needed until cleared by OB. Your test was positive for BV. Take Flagyl as directed. Avoid alcohol use with flagyl or it will cause you to vomit. Drink pelnty of water. Keep ob/gyn appt Thursday as  scheduled. Your gc/chlamydia test is pending,check my chart for results. ? ? ? ? ?  ED Prescriptions   ? ? Medication Sig Dispense Auth. Provider  ? metroNIDAZOLE (FLAGYL) 500 MG tablet Take 1 tablet (500 mg total) by mouth 2 (two) times daily for 7 days. 14 tablet Sabrina Keough, Para March, NP  ? ?  ? ?PDMP not reviewed this encounter. ?  ?Clancy Gourd, NP ?09/13/21 1752 ? ?

## 2021-09-13 NOTE — ED Triage Notes (Signed)
Pt had a miscarriage 2 weeks ago. Pt was given the "miscarriage pill" and states that 2 days after the bleeding she begin having a lot of vaginal discharge and irritation. ? ?Pt c/o ongoing vaginal discharge and irritation.  ? ?Pt asks for STD testing to be done.  ?

## 2021-09-13 NOTE — Discharge Instructions (Addendum)
Avoid sexual activity, douching, tampon use, wear pads if needed until cleared by OB. Your test was positive for BV. Take Flagyl as directed. Avoid alcohol use with flagyl or it will cause you to vomit. Drink pelnty of water. Keep ob/gyn appt Thursday as scheduled. Your gc/chlamydia test is pending,check my chart for results. ?

## 2021-09-14 LAB — CERVICOVAGINAL ANCILLARY ONLY
Chlamydia: NEGATIVE
Comment: NEGATIVE
Comment: NEGATIVE
Comment: NORMAL
Neisseria Gonorrhea: NEGATIVE
Trichomonas: NEGATIVE

## 2021-09-15 DIAGNOSIS — O039 Complete or unspecified spontaneous abortion without complication: Secondary | ICD-10-CM | POA: Insufficient documentation

## 2021-09-21 ENCOUNTER — Encounter: Payer: Self-pay | Admitting: Emergency Medicine

## 2021-09-21 ENCOUNTER — Other Ambulatory Visit: Payer: Self-pay

## 2021-09-21 ENCOUNTER — Ambulatory Visit
Admission: EM | Admit: 2021-09-21 | Discharge: 2021-09-21 | Disposition: A | Discharge status: Home / Self Care | Payer: Medicaid Other | Attending: Emergency Medicine | Admitting: Emergency Medicine

## 2021-09-21 DIAGNOSIS — R040 Epistaxis: Secondary | ICD-10-CM | POA: Diagnosis not present

## 2021-09-21 DIAGNOSIS — J302 Other seasonal allergic rhinitis: Secondary | ICD-10-CM | POA: Insufficient documentation

## 2021-09-21 DIAGNOSIS — J029 Acute pharyngitis, unspecified: Secondary | ICD-10-CM | POA: Diagnosis not present

## 2021-09-21 DIAGNOSIS — R0981 Nasal congestion: Secondary | ICD-10-CM | POA: Diagnosis not present

## 2021-09-21 LAB — GROUP A STREP BY PCR: Group A Strep by PCR: NOT DETECTED

## 2021-09-21 NOTE — ED Triage Notes (Signed)
Pt c/o sore throat, and epistaxis. Started over a week ago. Denies fever.  ?

## 2021-09-21 NOTE — ED Provider Notes (Signed)
?[facility] ? ? ? ?CSN: [id number] ?Arrival date & time: 09/21/21  1320 ? ? ?  ? ?History   ?Chief Complaint ?Chief Complaint  ?Patient presents with  ? Epistaxis  ? Sore Throat  ? ? ?HPI ?Dorothy Wallace is a 21 y.o. female.  ? ?Patient presents with nasal congestion, nosebleeds, sore throat and generalized headache occurring intermittently for 7 days.  Tolerating food and liquids.  No known sick contacts has attempted use of NyQuil which has helped her rest.  History of seasonal allergies, not currently taking medications.  Denies fever, chills, body aches, coughing, shortness of breath, wheezing.    ? ?Past Medical History:  ?Diagnosis Date  ? Environmental allergies   ? History of chlamydia   ? History of gonorrhea   ? History of miscarriage   ? ? ?Patient Active Problem List  ? Diagnosis Date Noted  ? BV (bacterial vaginosis) 09/13/2021  ? Vaginal discharge 09/13/2021  ? ? ?History reviewed. No pertinent surgical history. ? ?OB History   ?No obstetric history on file. ?  ? ? ? ?Home Medications   ? ?Prior to Admission medications   ?Medication Sig Start Date End Date Taking? Authorizing Provider  ?promethazine-dextromethorphan (PROMETHAZINE-DM) 6.25-15 MG/5ML syrup Take 5 mLs by mouth 3 (three) times daily as needed for cough. 07/10/21   Bing Neighbors, FNP  ? ? ?Family History ?Family History  ?Problem Relation Age of Onset  ? Stroke Mother   ? Multiple sclerosis Father   ? ? ?Social History ?Social History  ? ?Tobacco Use  ? Smoking status: Never  ? Smokeless tobacco: Never  ?Vaping Use  ? Vaping Use: Never used  ?Substance Use Topics  ? Alcohol use: Never  ? Drug use: Never  ? ? ? ?Allergies   ?Pollen extract ? ? ?Review of Systems ?Review of Systems  ?Constitutional: Negative.   ?HENT:  Positive for congestion, nosebleeds and sore throat. Negative for dental problem, drooling, ear discharge, ear pain, facial swelling, hearing loss, mouth sores, postnasal drip, rhinorrhea, sinus pressure, sinus  pain, sneezing, tinnitus, trouble swallowing and voice change.   ?Respiratory: Negative.    ?Cardiovascular: Negative.   ?Gastrointestinal: Negative.   ?Neurological:  Positive for headaches. Negative for dizziness, tremors, seizures, syncope, facial asymmetry, speech difficulty, weakness, light-headedness and numbness.  ? ? ?Physical Exam ?Triage Vital Signs ?ED Triage Vitals  ?Enc Vitals Group  ?   BP 09/21/21 1334 122/75  ?   Pulse Rate 09/21/21 1334 88  ?   Resp 09/21/21 1334 18  ?   Temp 09/21/21 1334 98.2 ?F (36.8 ?C)  ?   Temp Source 09/21/21 1334 Oral  ?   SpO2 09/21/21 1334 100 %  ?   Weight 09/21/21 1331 216 lb 14.9 oz (98.4 kg)  ?   Height 09/21/21 1331 5\' 9"  (1.753 m)  ?   Head Circumference --   ?   Peak Flow --   ?   Pain Score 09/21/21 1331 7  ?   Pain Loc --   ?   Pain Edu? --   ?   Excl. in [place]? --   ? ?No data found. ? ?Updated Vital Signs ?BP 122/75 (BP Location: Left Arm)   Pulse 88   Temp 98.2 ?F (36.8 ?C) (Oral)   Resp 18   Ht 5\' 9"  (1.753 m)   Wt 216 lb 14.9 oz (98.4 kg)   LMP  (LMP Unknown) Comment: recent miscarriage  SpO2 100%  BMI 32.04 kg/m?  ? ?Visual Acuity ?Right Eye Distance:   ?Left Eye Distance:   ?Bilateral Distance:   ? ?Right Eye Near:   ?Left Eye Near:    ?Bilateral Near:    ? ?Physical Exam ?Constitutional:   ?   Appearance: She is well-developed.  ?HENT:  ?   Head: Normocephalic.  ?   Right Ear: Tympanic membrane and ear canal normal.  ?   Left Ear: Tympanic membrane and ear canal normal.  ?   Nose: Congestion present.  ?   Mouth/Throat:  ?   Mouth: Mucous membranes are moist.  ?   Pharynx: Posterior oropharyngeal erythema present.  ?   Tonsils: No tonsillar exudate. 0 on the right. 0 on the left.  ?Cardiovascular:  ?   Rate and Rhythm: Normal rate and regular rhythm.  ?   Heart sounds: Normal heart sounds.  ?Musculoskeletal:  ?   Cervical back: Normal range of motion and neck supple.  ?Neurological:  ?   General: No focal deficit present.  ?   Mental Status: She is  alert and oriented to person, place, and time.  ?Psychiatric:     ?   Mood and Affect: Mood normal.     ?   Behavior: Behavior normal.  ? ? ? ?UC Treatments / Results  ?Labs ?(all labs ordered are listed, but only abnormal results are displayed) ?Labs Reviewed - No data to display ? ?EKG ? ? ?Radiology ?No results found. ? ?Procedures ?Procedures (including critical care time) ? ?Medications Ordered in UC ?Medications - No data to display ? ?Initial Impression / Assessment and Plan / UC Course  ?I have reviewed the triage vital signs and the nursing notes. ? ?Pertinent labs & imaging results that were available during my care of the patient were reviewed by me and considered in my medical decision making (see chart for details). ? ?Seasonal allergies, sore throat, nasal congestion, epistaxis ? ?Strep test negative, vitals are stable, O2 saturation 100%, etiology is symptoms are most likely related to seasonal allergies, discussed with patient, recommended taking daily antihistamine as well as Flonase and Mucinex to further help manage congestion, for the nosebleeds is most likely a result of dry nasal turbinates, recommended moisturizing with petroleum based product daily as well as using humidifiers steamed bathrooms to further moisten the area, may follow-up with urgent care as needed for persisting symptoms, work note given ?Final Clinical Impressions(s) / UC Diagnoses  ? ?Final diagnoses:  ?None  ? ?Discharge Instructions   ?None ?  ? ?ED Prescriptions   ?None ?  ? ?PDMP not reviewed this encounter. ?  ?Valinda Hoar, NP ?09/21/21 1431 ? ?

## 2021-09-21 NOTE — Discharge Instructions (Signed)
Strep test negative ? ?Your symptoms today are most likely related to your allergies ? ?For your nosebleeds ? ?-You may apply petroleum based products such as Vaseline inside of the nose with a Q-tip 1-2 times daily to help keep moisturized,  ?-you may sit in a steam bathroom to help moisten the airways, ?- you may use a humidifier at nighttime ? ?For your congestion ? ?-Begin use of Flonase every morning, which is a steroid nasal spray, this will help to decrease the amount of congestion present and also clear your sinus passages ?-Begin use of an antihistamine such as Claritin or Zyrtec which will help reduce the amount of secretions that your body produces ?--You may use Mucinex in addition which will help thin the secretions and allow them to drain ? ?For your sore throat ? ?-There was no bacteria present on exam, and symptoms are most likely being exacerbated by the amount of congestion present ?-You may attempt salt water gargles, warm liquids, throat lozenges, soft foods, teaspoons of honey and over-the-counter Chloraseptic spray to help manage ?

## 2021-10-21 ENCOUNTER — Ambulatory Visit
Admission: EM | Admit: 2021-10-21 | Discharge: 2021-10-21 | Disposition: A | Discharge status: Home / Self Care | Payer: Medicaid Other | Attending: Emergency Medicine | Admitting: Emergency Medicine

## 2021-10-21 DIAGNOSIS — B9689 Other specified bacterial agents as the cause of diseases classified elsewhere: Secondary | ICD-10-CM | POA: Insufficient documentation

## 2021-10-21 DIAGNOSIS — N760 Acute vaginitis: Secondary | ICD-10-CM | POA: Insufficient documentation

## 2021-10-21 LAB — URINALYSIS, ROUTINE W REFLEX MICROSCOPIC
Bilirubin Urine: NEGATIVE
Glucose, UA: NEGATIVE mg/dL
Hgb urine dipstick: NEGATIVE
Ketones, ur: NEGATIVE mg/dL
Leukocytes,Ua: NEGATIVE
Nitrite: NEGATIVE
Protein, ur: NEGATIVE mg/dL
Specific Gravity, Urine: 1.02 (ref 1.005–1.030)
pH: 8.5 — ABNORMAL HIGH (ref 5.0–8.0)

## 2021-10-21 LAB — WET PREP, GENITAL
Sperm: NONE SEEN
Trich, Wet Prep: NONE SEEN
WBC, Wet Prep HPF POC: NONE SEEN — AB (ref <10)
Yeast Wet Prep HPF POC: NONE SEEN

## 2021-10-21 MED ORDER — METRONIDAZOLE 0.75 % VA GEL: 1.0000 | Freq: Every day | VAGINAL | 0 refills | Status: AC

## 2021-10-21 NOTE — ED Triage Notes (Signed)
Patient is here for "recent miscarriage along with BV". Discharge "remains even though after menses". Concerned with BV and/or STI. Requests Testing. No fever.  ?

## 2021-10-21 NOTE — Discharge Instructions (Signed)
We will call you if the STD test is positive 

## 2021-10-21 NOTE — ED Provider Notes (Signed)
?[facility] ? ? ? ?CSN: [id number] ?Arrival date & time: 10/21/21  1232 ? ? ?  ? ?History   ?Chief Complaint ?Chief Complaint  ?Patient presents with  ? Personal Problem  ? ? ?HPI ?Dorothy Wallace is a 21 y.o. female who presents with abnormal vaginal discharge for a couple of weeks. Would like STD testing. Initially did not have an odor, but since after her period in April, she has an odor. She had a miscarriage in March and admits she is with the same sexual partner  with whom she got pregnant with.  ? ? ? ?Past Medical History:  ?Diagnosis Date  ? Environmental allergies   ? History of chlamydia   ? History of gonorrhea   ? History of miscarriage   ? ? ?Patient Active Problem List  ? Diagnosis Date Noted  ? Miscarriage 09/15/2021  ? BV (bacterial vaginosis) 09/13/2021  ? Vaginal discharge 07/08/2021  ? Nipple discharge 07/08/2021  ? Well woman exam 07/08/2021  ? COVID-19 11/10/2019  ? Pregnancy of unknown anatomic location 08/23/2019  ? Chlamydia infection 10/09/2017  ? Seasonal allergic rhinitis due to pollen 10/08/2017  ? Dysthymia 01/21/2016  ? Oppositional defiant disorder 09/15/2013  ? ? ?History reviewed. No pertinent surgical history. ? ?OB History   ?No obstetric history on file. ?  ? ? ? ?Home Medications   ? ?Prior to Admission medications   ?Medication Sig Start Date End Date Taking? Authorizing Provider  ?cloNIDine (CATAPRES) 0.2 MG tablet Take by mouth. 01/21/16  Yes [provider]  ?dexmethylphenidate (FOCALIN XR) 15 MG 24 hr capsule Take by mouth. 02/20/16  Yes [provider]  ?levonorgestrel-ethinyl estradiol (SEASONALE) 0.15-0.03 MG tablet Take by mouth. 06/08/19  Yes [provider]  ?melatonin 3 MG TABS tablet Take by mouth. 10/05/15  Yes [provider]  ?metroNIDAZOLE (METROGEL VAGINAL) 0.75 % vaginal gel Place 1 Applicatorful vaginally at bedtime for 7 days. 10/21/21 10/28/21 Yes Rodriguez-Southworth, Nettie Elm, PA-C  ?misoprostol (CYTOTEC) 200 MCG tablet  Take 4 tablets (800 mcg) by mouth, repeat once in 24 hours if necessary 09/08/21  Yes [provider]  ?Multiple Vitamins-Minerals (KP ADULTS DAILY FORMULA) TABS Take by mouth. 01/10/18  Yes [provider]  ?norethindrone-ethinyl estradiol-FE (LOESTRIN FE) 1-20 MG-MCG tablet Take 1 tablet by mouth daily. 09/15/21 09/15/22 Yes [provider]  ?norgestimate-ethinyl estradiol (ORTHO-CYCLEN) 0.25-35 MG-MCG tablet Take 1 tablet by mouth daily. 03/10/21  Yes [provider]  ?oxyCODONE (OXY IR/ROXICODONE) 5 MG immediate release tablet Take by mouth. 09/01/21  Yes [provider]  ?azithromycin (ZITHROMAX) 500 MG tablet Take 1,000 mg by mouth once. 07/11/21   [provider]  ?ferrous sulfate 325 (65 FE) MG tablet Take by mouth.    [provider]  ?fluconazole (DIFLUCAN) 150 MG tablet Take 150 mg by mouth once. 07/18/21   [provider]  ?ibuprofen (ADVIL) 600 MG tablet Take 600 mg by mouth 3 (three) times daily. 09/01/21   [provider]  ?JUNEL FE 1/20 1-20 MG-MCG tablet Take 1 tablet by mouth daily. 09/15/21   [provider]  ?misoprostol (CYTOTEC) 200 MCG tablet Take 200 mcg by mouth 4 (four) times daily. 09/08/21   [provider]  ?Multiple Vitamin (MULTI-VITAMIN PO) Take 1 tablet by mouth daily.    [provider]  ?ondansetron (ZOFRAN-ODT) 4 MG disintegrating tablet Take 4 mg by mouth every 6 (six) hours as needed. 09/01/21   [provider]  ?promethazine-dextromethorphan (PROMETHAZINE-DM) 6.25-15 MG/5ML syrup  Take 5 mLs by mouth 3 (three) times daily as needed for cough. 07/10/21   Bing NeighborsHarris, Kimberly S, FNP  ? ? ?Family History ?Family History  ?Problem Relation Age of Onset  ? Stroke Mother   ? Multiple sclerosis Father   ? ? ?Social History ?Social History  ? ?Tobacco Use  ? Smoking status: Never  ? Smokeless tobacco: Never  ?Vaping Use  ? Vaping Use: Never used  ?Substance Use Topics  ? Alcohol use:  Never  ? Drug use: Never  ? ? ? ?Allergies   ?Pollen extract ? ? ?Review of Systems ?Review of Systems  ?Genitourinary:  Positive for vaginal discharge. Negative for difficulty urinating.  ? ? ?Physical Exam ?Triage Vital Signs ?ED Triage Vitals  ?Enc Vitals Group  ?   BP 10/21/21 1244 132/89  ?   Pulse Rate 10/21/21 1244 71  ?   Resp 10/21/21 1244 18  ?   Temp 10/21/21 1244 97.8 ?F (36.6 ?C)  ?   Temp Source 10/21/21 1244 Oral  ?   SpO2 10/21/21 1244 100 %  ?   Weight 10/21/21 1241 229 lb 4.8 oz (104 kg)  ?   Height 10/21/21 1241 5' 8.5" (1.74 m)  ?   Head Circumference --   ?   Peak Flow --   ?   Pain Score 10/21/21 1238 0  ?   Pain Loc --   ?   Pain Edu? --   ?   Excl. in GC? --   ? ?No data found. ? ?Updated Vital Signs ?BP 132/89 (BP Location: Left Arm)   Pulse 71   Temp 97.8 ?F (36.6 ?C) (Oral)   Resp 18   Ht 5' 8.5" (1.74 m)   Wt 229 lb 4.8 oz (104 kg)   LMP 10/10/2021   SpO2 100%   BMI 34.36 kg/m?  ? ?Visual Acuity ?Right Eye Distance:   ?Left Eye Distance:   ?Bilateral Distance:   ? ?Right Eye Near:   ?Left Eye Near:    ?Bilateral Near:    ? ?Physical Exam ?Constitutional:   ?   General: She is not in acute distress. ?   Appearance: She is not toxic-appearing.  ?Eyes:  ?   General: No scleral icterus. ?   Conjunctiva/sclera: Conjunctivae normal.  ?Pulmonary:  ?   Effort: Pulmonary effort is normal.  ?Musculoskeletal:     ?   General: Normal range of motion.  ?   Cervical back: Neck supple.  ?Neurological:  ?   Mental Status: She is alert and oriented to person, place, and time.  ?   Gait: Gait normal.  ?Psychiatric:     ?   Mood and Affect: Mood normal.     ?   Behavior: Behavior normal.     ?   Thought Content: Thought content normal.     ?   Judgment: Judgment normal.  ? ? ? ?UC Treatments / Results  ?Labs ?(all labs ordered are listed, but only abnormal results are displayed) ?Labs Reviewed  ?WET PREP, GENITAL - Abnormal; Notable for the following components:  ?    Result Value  ? Clue Cells Wet  Prep HPF POC PRESENT (*)   ? WBC, Wet Prep HPF POC NONE SEEN (*)   ? All other components within normal limits  ?URINALYSIS, ROUTINE W REFLEX MICROSCOPIC - Abnormal; Notable for the following components:  ? pH 8.5 (*)   ? All other components within normal limits  ?CERVICOVAGINAL  ANCILLARY ONLY  ? ? ?EKG ? ? ?Radiology ?No results found. ? ?Procedures ?Procedures (including critical care time) ? ?Medications Ordered in UC ?Medications - No data to display ? ?Initial Impression / Assessment and Plan / UC Course  ?I have reviewed the triage vital signs and the nursing notes. ? ?Pertinent labs  results that were available during my care of the patient were reviewed by me and considered in my medical decision making (see chart for details). ? ?Has BV ? ?I placed her on Metrogel as noted ?We will call her with results when back. ? ? ?Final Clinical Impressions(s) / UC Diagnoses  ? ?Final diagnoses:  ?Bacterial vaginitis  ? ? ? ?Discharge Instructions   ? ?  ?We will call you if the STD test is positive  ? ? ? ? ?ED Prescriptions   ? ? Medication Sig Dispense Auth. Provider  ? metroNIDAZOLE (METROGEL VAGINAL) 0.75 % vaginal gel Place 1 Applicatorful vaginally at bedtime for 7 days. 70 g Rodriguez-Southworth, Nettie Elm, PA-C  ? ?  ? ?PDMP not reviewed this encounter. ?  ?Garey Ham, PA-C ?10/21/21 1548 ? ?

## 2021-10-24 LAB — CERVICOVAGINAL ANCILLARY ONLY
Chlamydia: NEGATIVE
Comment: NEGATIVE
Comment: NORMAL
Neisseria Gonorrhea: NEGATIVE

## 2021-11-28 ENCOUNTER — Other Ambulatory Visit: Payer: Self-pay

## 2021-11-28 ENCOUNTER — Ambulatory Visit
Admission: EM | Admit: 2021-11-28 | Discharge: 2021-11-28 | Disposition: A | Discharge status: Home / Self Care | Payer: Medicaid Other | Attending: Student | Admitting: Student

## 2021-11-28 ENCOUNTER — Encounter: Payer: Self-pay | Admitting: Emergency Medicine

## 2021-11-28 DIAGNOSIS — N760 Acute vaginitis: Secondary | ICD-10-CM | POA: Diagnosis not present

## 2021-11-28 DIAGNOSIS — Z3041 Encounter for surveillance of contraceptive pills: Secondary | ICD-10-CM | POA: Diagnosis not present

## 2021-11-28 LAB — WET PREP, GENITAL
Clue Cells Wet Prep HPF POC: NONE SEEN
Sperm: NONE SEEN
Trich, Wet Prep: NONE SEEN
WBC, Wet Prep HPF POC: <10 — AB (ref <10)
Yeast Wet Prep HPF POC: NONE SEEN

## 2021-11-28 LAB — PREGNANCY, URINE: Preg Test, Ur: NEGATIVE

## 2021-11-28 MED ORDER — FLUCONAZOLE 150 MG PO TABS: 150.0000 mg | ORAL_TABLET | Freq: Every day | ORAL | 0 refills | Status: DC

## 2021-11-28 MED ORDER — METRONIDAZOLE 500 MG PO TABS: 500.0000 mg | ORAL_TABLET | Freq: Two times a day (BID) | ORAL | 0 refills | Status: DC

## 2021-11-28 NOTE — Discharge Instructions (Addendum)
-  For bacterial vaginosis, start the antibiotic-Flagyl (metronidazole), 2 pills daily for 7 days.  You can take this with food if you have a sensitive stomach.  Avoid alcohol while taking this medication and for 2 days after as this will cause severe nausea and vomiting. -For your yeast infection, start the Diflucan (fluconazole)- Take one pill today (day 1). If you're still having symptoms in 3 days, take the second pill.  -We have sent testing for sexually transmitted infections. We will notify you of any positive results once they are received. If required, we will prescribe any medications you might need. Please refrain from all sexual activity until treatment is complete.  -Seek additional medical attention if you develop fevers/chills, new/worsening abdominal pain, new/worsening vaginal discomfort/discharge, etc.   

## 2021-11-28 NOTE — ED Triage Notes (Signed)
Patient was seen on 10/21/2021 for vaginal discharge.  Patient has recurrent episodes of BV.  Patient was provided medicine.  Symptoms have not improved.

## 2021-11-28 NOTE — ED Provider Notes (Signed)
[facility]    CSN: [id number] Arrival date & time: 11/28/21  1612      History   Chief Complaint Chief Complaint  Patient presents with   Vaginal Discharge    HPI Dorothy MichaelisZykira Haworth is a 21 y.o. female presenting with vaginal discharge for about 3 weeks.  History gonorrhea, chlamydia, recurrent BV.  Has followed with GYN for this in the past, but her last visit for discharge was with us on 10/21/2021.  She states that she took the metronidazole cream as directed, and her symptoms did improve for about 1 week, but she once again has 3 weeks of symptoms.  Describes this as thick white discharge with mild smell and some external vaginal irritation.  She does have a new female partner.  OCP contraception.  Denies abdominal pain, flank pain, fever/chills, dysuria, hematuria, urinary incontinence.  HPI  Past Medical History:  Diagnosis Date   Environmental allergies    History of chlamydia    History of gonorrhea    History of miscarriage     Patient Active Problem List   Diagnosis Date Noted   Miscarriage 09/15/2021   BV (bacterial vaginosis) 09/13/2021   Vaginal discharge 07/08/2021   Nipple discharge 07/08/2021   Well woman exam 07/08/2021   COVID-19 11/10/2019   Pregnancy of unknown anatomic location 08/23/2019   Chlamydia infection 10/09/2017   Seasonal allergic rhinitis due to pollen 10/08/2017   Dysthymia 01/21/2016   Oppositional defiant disorder 09/15/2013    History reviewed. No pertinent surgical history.  OB History   No obstetric history on file.      Home Medications    Prior to Admission medications   Medication Sig Start Date End Date Taking? Authorizing Provider  fluconazole (DIFLUCAN) 150 MG tablet Take 1 tablet (150 mg total) by mouth daily. -For your yeast infection, start the Diflucan (fluconazole)- Take one pill today (day 1). If you're still having symptoms in 3 days, take the second pill. 11/28/21  Yes Rhys MartiniGraham, Ghina Bittinger E, PA-C  metroNIDAZOLE  (FLAGYL) 500 MG tablet Take 1 tablet (500 mg total) by mouth 2 (two) times daily. Avoid alcohol while taking this medication and for 2 days after 11/28/21  Yes Rhys MartiniGraham, Tinzlee Craker E, PA-C  cloNIDine (CATAPRES) 0.2 MG tablet Take by mouth. 01/21/16   [provider]  dexmethylphenidate (FOCALIN XR) 15 MG 24 hr capsule Take by mouth. 02/20/16   [provider]  ferrous sulfate 325 (65 FE) MG tablet Take by mouth.    [provider]  ibuprofen (ADVIL) 600 MG tablet Take 600 mg by mouth 3 (three) times daily. 09/01/21   [provider]  JUNEL FE 1/20 1-20 MG-MCG tablet Take 1 tablet by mouth daily. 09/15/21   [provider]  melatonin 3 MG TABS tablet Take by mouth. 10/05/15   [provider]  Multiple Vitamin (MULTI-VITAMIN PO) Take 1 tablet by mouth daily.    [provider]  Multiple Vitamins-Minerals (KP ADULTS DAILY FORMULA) TABS Take by mouth. 01/10/18   [provider]  ondansetron (ZOFRAN-ODT) 4 MG disintegrating tablet Take 4 mg by mouth every 6 (six) hours as needed. 09/01/21   [provider]  oxyCODONE (OXY IR/ROXICODONE) 5 MG immediate release tablet Take by mouth. 09/01/21   [provider]  promethazine-dextromethorphan (PROMETHAZINE-DM) 6.25-15 MG/5ML syrup Take 5 mLs by mouth 3 (three) times daily as needed for cough. 07/10/21   Bing NeighborsHarris, Kimberly S, FNP    Family History Family History  Problem Relation Age  of Onset   Stroke Mother    Multiple sclerosis Father     Social History Social History   Tobacco Use   Smoking status: Never   Smokeless tobacco: Never  Vaping Use   Vaping Use: Never used  Substance Use Topics   Alcohol use: Never   Drug use: Never     Allergies   Pollen extract   Review of Systems Review of Systems  Constitutional:  Negative for chills and fever.  HENT:  Negative for sore throat.   Eyes:  Negative for pain and redness.  Respiratory:  Negative for shortness of breath.    Cardiovascular:  Negative for chest pain.  Gastrointestinal:  Negative for abdominal pain, diarrhea, nausea and vomiting.  Genitourinary:  Positive for vaginal discharge. Negative for decreased urine volume, difficulty urinating, dysuria, flank pain, frequency, genital sores, hematuria and urgency.  Musculoskeletal:  Negative for back pain.  Skin:  Negative for rash.  All other systems reviewed and are negative.    Physical Exam Triage Vital Signs ED Triage Vitals  Enc Vitals Group     BP 11/28/21 1629 (!) 116/58     Pulse Rate 11/28/21 1629 81     Resp 11/28/21 1629 18     Temp 11/28/21 1629 98.4 F (36.9 C)     Temp Source 11/28/21 1629 Oral     SpO2 11/28/21 1629 100 %     Weight --      Height --      Head Circumference --      Peak Flow --      Pain Score 11/28/21 1624 6     Pain Loc --      Pain Edu? --      Excl. in GC? --    No data found.  Updated Vital Signs BP (!) 116/58 (BP Location: Right Arm) Comment (BP Location): large cuff  Pulse 81   Temp 98.4 F (36.9 C) (Oral)   Resp 18   LMP 11/06/2021   SpO2 100%   Visual Acuity Right Eye Distance:   Left Eye Distance:   Bilateral Distance:    Right Eye Near:   Left Eye Near:    Bilateral Near:     Physical Exam Vitals reviewed.  Constitutional:      General: She is not in acute distress.    Appearance: Normal appearance. She is not ill-appearing.  HENT:     Head: Normocephalic and atraumatic.     Mouth/Throat:     Mouth: Mucous membranes are moist.     Comments: Moist mucous membranes Eyes:     Extraocular Movements: Extraocular movements intact.     Pupils: Pupils are equal, round, and reactive to light.  Cardiovascular:     Rate and Rhythm: Normal rate and regular rhythm.     Heart sounds: Normal heart sounds.  Pulmonary:     Effort: Pulmonary effort is normal.     Breath sounds: Normal breath sounds. No wheezing, rhonchi or rales.  Abdominal:     General: Bowel sounds are normal. There  is no distension.     Palpations: Abdomen is soft. There is no mass.     Tenderness: There is no abdominal tenderness. There is no right CVA tenderness, left CVA tenderness, guarding or rebound.  Genitourinary:    Comments: declined Skin:    General: Skin is warm.     Capillary Refill: Capillary refill takes less than 2 seconds.     Comments: Good skin  turgor  Neurological:     General: No focal deficit present.     Mental Status: She is alert and oriented to person, place, and time.  Psychiatric:        Mood and Affect: Mood normal.        Behavior: Behavior normal.      UC Treatments / Results  Labs (all labs ordered are listed, but only abnormal results are displayed) Labs Reviewed  WET PREP, GENITAL - Abnormal; Notable for the following components:      Result Value   WBC, Wet Prep HPF POC <10 (*)    All other components within normal limits  PREGNANCY, URINE  CERVICOVAGINAL ANCILLARY ONLY    EKG   Radiology No results found.  Procedures Procedures (including critical care time)  Medications Ordered in UC Medications - No data to display  Initial Impression / Assessment and Plan / UC Course  I have reviewed the triage vital signs and the nursing notes.  Pertinent labs & imaging results that were available during my care of the patient were reviewed by me and considered in my medical decision making (see chart for details).     This patient is a very pleasant 21 y.o. year old female presenting with recurrent vaginitis. Afebrile, nontachycardic, no reproducible abd pain or CVAT. OCP contraception  New female partner. Will send self-swab for G/C, trich, yeast, BV testing. Declines HIV, RPR. Safe sex precautions.  Wet prep - negative.   Given long history of BV, she prefers to treat with metronidazole and Diflucan while awaiting lab results.  Sent as below.  ED return precautions discussed. Patient verbalizes understanding and agreement.    Final Clinical  Impressions(s) / UC Diagnoses   Final diagnoses:  Vaginitis and vulvovaginitis  Oral contraceptive use     Discharge Instructions      -For bacterial vaginosis, start the antibiotic-Flagyl (metronidazole), 2 pills daily for 7 days.  You can take this with food if you have a sensitive stomach.  Avoid alcohol while taking this medication and for 2 days after as this will cause severe nausea and vomiting. -For your yeast infection, start the Diflucan (fluconazole)- Take one pill today (day 1). If you're still having symptoms in 3 days, take the second pill.  -We have sent testing for sexually transmitted infections. We will notify you of any positive results once they are received. If required, we will prescribe any medications you might need. Please refrain from all sexual activity until treatment is complete.  -Seek additional medical attention if you develop fevers/chills, new/worsening abdominal pain, new/worsening vaginal discomfort/discharge, etc.     ED Prescriptions     Medication Sig Dispense Auth. Provider   fluconazole (DIFLUCAN) 150 MG tablet Take 1 tablet (150 mg total) by mouth daily. -For your yeast infection, start the Diflucan (fluconazole)- Take one pill today (day 1). If you're still having symptoms in 3 days, take the second pill. 2 tablet Rhys Martini, PA-C   metroNIDAZOLE (FLAGYL) 500 MG tablet Take 1 tablet (500 mg total) by mouth 2 (two) times daily. Avoid alcohol while taking this medication and for 2 days after 14 tablet Rhys Martini, PA-C      PDMP not reviewed this encounter.   Rhys Martini, PA-C 11/28/21 1729

## 2021-11-30 LAB — CERVICOVAGINAL ANCILLARY ONLY
Bacterial Vaginitis (gardnerella): POSITIVE — AB
Candida Glabrata: NEGATIVE
Candida Vaginitis: NEGATIVE
Chlamydia: NEGATIVE
Comment: NEGATIVE
Comment: NEGATIVE
Comment: NEGATIVE
Comment: NEGATIVE
Comment: NEGATIVE
Comment: NORMAL
Neisseria Gonorrhea: NEGATIVE
Trichomonas: NEGATIVE

## 2022-01-10 ENCOUNTER — Ambulatory Visit
Admission: RE | Admit: 2022-01-10 | Discharge: 2022-01-10 | Disposition: A | Discharge status: Home / Self Care | Payer: Medicaid Other | Source: Ambulatory Visit | Attending: Emergency Medicine | Admitting: Emergency Medicine

## 2022-01-10 VITALS — BP 122/84 | HR 82 | Temp 98.6°F | Resp 20

## 2022-01-10 DIAGNOSIS — N898 Other specified noninflammatory disorders of vagina: Secondary | ICD-10-CM | POA: Diagnosis present

## 2022-01-10 NOTE — ED Provider Notes (Signed)
[name]    CSN: [id number] Arrival date & time: 01/10/22  1314      History   Chief Complaint Chief Complaint  Patient presents with   Vaginal Itching    HPI Dorothy Wallace is a 21 y.o. female.  Patient presents with vaginal irritation and clear vaginal discharge x2 to 3 months.  She denies fever, chills, rash, lesions, pelvic pain, abdominal pain, nausea, vomiting, diarrhea, constipation, hematuria, dysuria, flank pain, or other symptoms.  She has been treated for bacterial vaginitis and candidal vaginitis since the onset of her symptoms.  She requests STD testing today.    The history is provided by the patient and medical records.    Past Medical History:  Diagnosis Date   Environmental allergies    History of chlamydia    History of gonorrhea    History of miscarriage     Patient Active Problem List   Diagnosis Date Noted   Miscarriage 09/15/2021   BV (bacterial vaginosis) 09/13/2021   Vaginal discharge 07/08/2021   Nipple discharge 07/08/2021   Well woman exam 07/08/2021   COVID-19 11/10/2019   Pregnancy of unknown anatomic location 08/23/2019   Chlamydia infection 10/09/2017   Seasonal allergic rhinitis due to pollen 10/08/2017   Dysthymia 01/21/2016   Oppositional defiant disorder 09/15/2013    History reviewed. No pertinent surgical history.  OB History   No obstetric history on file.      Home Medications    Prior to Admission medications   Medication Sig Start Date End Date Taking? Authorizing Provider  cloNIDine (CATAPRES) 0.2 MG tablet Take by mouth. 01/21/16   [provider]  dexmethylphenidate (FOCALIN XR) 15 MG 24 hr capsule Take by mouth. 02/20/16   [provider]  ferrous sulfate 325 (65 FE) MG tablet Take by mouth.    [provider]  fluconazole (DIFLUCAN) 150 MG tablet Take 1 tablet (150 mg total) by mouth daily. -For your yeast infection, start the Diflucan (fluconazole)- Take one pill today (day 1).  If you're still having symptoms in 3 days, take the second pill. 11/28/21   Rhys Martini, PA-C  ibuprofen (ADVIL) 600 MG tablet Take 600 mg by mouth 3 (three) times daily. 09/01/21   [provider]  JUNEL FE 1/20 1-20 MG-MCG tablet Take 1 tablet by mouth daily. 09/15/21   [provider]  melatonin 3 MG TABS tablet Take by mouth. 10/05/15   [provider]  metroNIDAZOLE (FLAGYL) 500 MG tablet Take 1 tablet (500 mg total) by mouth 2 (two) times daily. Avoid alcohol while taking this medication and for 2 days after 11/28/21   Rhys Martini, PA-C  Multiple Vitamin (MULTI-VITAMIN PO) Take 1 tablet by mouth daily.    [provider]  Multiple Vitamins-Minerals (KP ADULTS DAILY FORMULA) TABS Take by mouth. 01/10/18   [provider]  ondansetron (ZOFRAN-ODT) 4 MG disintegrating tablet Take 4 mg by mouth every 6 (six) hours as needed. 09/01/21   [provider]  oxyCODONE (OXY IR/ROXICODONE) 5 MG immediate release tablet Take by mouth. 09/01/21   [provider]  promethazine-dextromethorphan (PROMETHAZINE-DM) 6.25-15 MG/5ML syrup Take 5 mLs by mouth 3 (three) times daily as needed for cough. 07/10/21   Bing Neighbors, FNP    Family History Family History  Problem Relation Age of Onset   Stroke Mother    Multiple sclerosis Father     Social History Social History   Tobacco Use   Smoking status: Never  Smokeless tobacco: Never  Vaping Use   Vaping Use: Never used  Substance Use Topics   Alcohol use: Never   Drug use: Never     Allergies   Pollen extract   Review of Systems Review of Systems  Constitutional:  Negative for chills and fever.  Gastrointestinal:  Negative for abdominal pain, constipation, diarrhea, nausea and vomiting.  Genitourinary:  Positive for vaginal discharge. Negative for dysuria, flank pain, hematuria and pelvic pain.  Skin:  Negative for color change and rash.  All other systems reviewed and are  negative.    Physical Exam Triage Vital Signs ED Triage Vitals  Enc Vitals Group     BP 01/10/22 1327 122/84     Pulse Rate 01/10/22 1327 82     Resp 01/10/22 1327 20     Temp 01/10/22 1327 98.6 F (37 C)     Temp src --      SpO2 01/10/22 1327 98 %     Weight --      Height --      Head Circumference --      Peak Flow --      Pain Score 01/10/22 1326 0     Pain Loc --      Pain Edu? --      Excl. in GC? --    No data found.  Updated Vital Signs BP 122/84   Pulse 82   Temp 98.6 F (37 C)   Resp 20   LMP 12/20/2021   SpO2 98%   Visual Acuity Right Eye Distance:   Left Eye Distance:   Bilateral Distance:    Right Eye Near:   Left Eye Near:    Bilateral Near:     Physical Exam Vitals and nursing note reviewed.  Constitutional:      General: She is not in acute distress.    Appearance: She is well-developed. She is not ill-appearing.  HENT:     Mouth/Throat:     Mouth: Mucous membranes are moist.  Cardiovascular:     Rate and Rhythm: Normal rate and regular rhythm.  Pulmonary:     Effort: Pulmonary effort is normal. No respiratory distress.  Abdominal:     General: Bowel sounds are normal.     Palpations: Abdomen is soft.     Tenderness: There is no abdominal tenderness. There is no right CVA tenderness, left CVA tenderness, guarding or rebound.  Musculoskeletal:     Cervical back: Neck supple.  Skin:    General: Skin is warm and dry.  Neurological:     Mental Status: She is alert.  Psychiatric:        Mood and Affect: Mood normal.        Behavior: Behavior normal.      UC Treatments / Results  Labs (all labs ordered are listed, but only abnormal results are displayed) Labs Reviewed  CERVICOVAGINAL ANCILLARY ONLY    EKG   Radiology No results found.  Procedures Procedures (including critical care time)  Medications Ordered in UC Medications - No data to display  Initial Impression / Assessment and Plan / UC Course  I have  reviewed the triage vital signs and the nursing notes.  Pertinent labs & imaging results that were available during my care of the patient were reviewed by me and considered in my medical decision making (see chart for details).    Vaginal discharge, vaginal irritation.  Patient obtained vaginal self swab for testing.  Discussed that we  will call if test results are positive.  Discussed that she may require treatment at that time.  Discussed that sexual partner(s) may also require treatment.  Instructed patient to abstain from sexual activity for at least 7 days.  Instructed her to follow-up with her PCP or gynecologist if her symptoms are not improving.  Patient agrees to plan of care.   Final Clinical Impressions(s) / UC Diagnoses   Final diagnoses:  Vaginal discharge  Vaginal irritation     Discharge Instructions      Your vaginal tests are pending.  If your test results are positive, we will call you.  You and your sexual partner(s) may require treatment at that time.  Do not have sexual activity for at least 7 days.    Follow-up with your primary care provider or gynecologist if your symptoms are not improving.       ED Prescriptions   None    PDMP not reviewed this encounter.   Mickie Bail, NP 01/10/22 1353

## 2022-01-10 NOTE — Discharge Instructions (Addendum)
Your vaginal tests are pending.  If your test results are positive, we will call you.  You and your sexual partner(s) may require treatment at that time.  Do not have sexual activity for at least 7 days.    Follow up with your primary care provider or gynecologist if your symptoms are not improving.    

## 2022-01-10 NOTE — ED Triage Notes (Signed)
Pt here for vaginal irritation x several months on and off and has been dx with BV. Would like STI swab just in case.

## 2022-01-11 ENCOUNTER — Telehealth (HOSPITAL_COMMUNITY): Payer: Self-pay | Admitting: Emergency Medicine

## 2022-01-11 LAB — CERVICOVAGINAL ANCILLARY ONLY
Bacterial Vaginitis (gardnerella): POSITIVE — AB
Candida Glabrata: NEGATIVE
Candida Vaginitis: NEGATIVE
Chlamydia: NEGATIVE
Comment: NEGATIVE
Comment: NEGATIVE
Comment: NEGATIVE
Comment: NEGATIVE
Comment: NEGATIVE
Comment: NORMAL
Neisseria Gonorrhea: NEGATIVE
Trichomonas: NEGATIVE

## 2022-01-11 MED ORDER — CLINDAMYCIN HCL 150 MG PO CAPS: 300.0000 mg | ORAL_CAPSULE | Freq: Two times a day (BID) | ORAL | 0 refills | Status: AC

## 2022-01-23 ENCOUNTER — Telehealth: Payer: Medicaid Other | Admitting: Physician Assistant

## 2022-01-23 DIAGNOSIS — T3695XA Adverse effect of unspecified systemic antibiotic, initial encounter: Secondary | ICD-10-CM | POA: Diagnosis not present

## 2022-01-23 DIAGNOSIS — B379 Candidiasis, unspecified: Secondary | ICD-10-CM

## 2022-01-23 MED ORDER — FLUCONAZOLE 150 MG PO TABS: 150.0000 mg | ORAL_TABLET | Freq: Every day | ORAL | 0 refills | Status: DC

## 2022-01-23 NOTE — Progress Notes (Signed)
E-Visit for Vaginal Symptoms ° °We are sorry that you are not feeling well. Here is how we plan to help! °Based on what you shared with me it looks like you: May have a yeast vaginosis secondary to recent antibiotic use. ° °Vaginosis is an inflammation of the vagina that can result in discharge, itching and pain. The cause is usually a change in the normal balance of vaginal bacteria or an infection. Vaginosis can also result from reduced estrogen levels after menopause. ° °The most common causes of vaginosis are: ° ° Bacterial vaginosis which results from an overgrowth of one on several organisms that are normally present in your vagina. ° ° Yeast infections which are caused by a naturally occurring fungus called candida. ° ° Vaginal atrophy (atrophic vaginosis) which results from the thinning of the vagina from reduced estrogen levels after menopause. ° ° Trichomoniasis which is caused by a parasite and is commonly transmitted by sexual intercourse. ° °Factors that increase your risk of developing vaginosis include: °Medications, such as antibiotics and steroids °Uncontrolled diabetes °Use of hygiene products such as bubble bath, vaginal spray or vaginal deodorant °Douching °Wearing damp or tight-fitting clothing °Using an intrauterine device (IUD) for birth control °Hormonal changes, such as those associated with pregnancy, birth control pills or menopause °Sexual activity °Having a sexually transmitted infection ° °Your treatment plan is A single Diflucan (fluconazole) 150mg tablet once.  I have electronically sent this prescription into the pharmacy that you have chosen. ° °Be sure to take all of the medication as directed. Stop taking any medication if you develop a rash, tongue swelling or shortness of breath. Mothers who are breast feeding should consider pumping and discarding their breast milk while on these antibiotics. However, there is no consensus that infant exposure at these doses would be harmful.   °Remember that medication creams can weaken latex condoms. °. ° ° °HOME CARE: ° °Good hygiene may prevent some types of vaginosis from recurring and may relieve some symptoms: ° °Avoid baths, hot tubs and whirlpool spas. Rinse soap from your outer genital area after a shower, and dry the area well to prevent irritation. Don't use scented or harsh soaps, such as those with deodorant or antibacterial action. °Avoid irritants. These include scented tampons and pads. °Wipe from front to back after using the toilet. Doing so avoids spreading fecal bacteria to your vagina. ° °Other things that may help prevent vaginosis include: ° °Don't douche. Your vagina doesn't require cleansing other than normal bathing. Repetitive douching disrupts the normal organisms that reside in the vagina and can actually increase your risk of vaginal infection. Douching won't clear up a vaginal infection. °Use a latex condom. Both female and female latex condoms may help you avoid infections spread by sexual contact. °Wear cotton underwear. Also wear pantyhose with a cotton crotch. If you feel comfortable without it, skip wearing underwear to bed. Yeast thrives in moist environments °Your symptoms should improve in the next day or two. ° °GET HELP RIGHT AWAY IF: ° °You have pain in your lower abdomen ( pelvic area or over your ovaries) °You develop nausea or vomiting °You develop a fever °Your discharge changes or worsens °You have persistent pain with intercourse °You develop shortness of breath, a rapid pulse, or you faint. ° °These symptoms could be signs of problems or infections that need to be evaluated by a medical provider now. ° °MAKE SURE YOU  ° °Understand these instructions. °Will watch your condition. °Will get help right   away if you are not doing well or get worse. ° °Thank you for choosing an e-visit. ° °Your e-visit answers were reviewed by a board certified advanced clinical practitioner to complete your personal care plan.  Depending upon the condition, your plan could have included both over the counter or prescription medications. ° °Please review your pharmacy choice. Make sure the pharmacy is open so you can pick up prescription now. If there is a problem, you may contact your provider through MyChart messaging and have the prescription routed to another pharmacy.  Your safety is important to us. If you have drug allergies check your prescription carefully.  ° °For the next 24 hours you can use MyChart to ask questions about today's visit, request a non-urgent call back, or ask for a work or school excuse. °You will get an email in the next two days asking about your experience. I hope that your e-visit has been valuable and will speed your recovery. ° °I provided 5 minutes of non face-to-face time during this encounter for chart review and documentation.  ° °

## 2022-01-29 ENCOUNTER — Ambulatory Visit
Admission: EM | Admit: 2022-01-29 | Discharge: 2022-01-29 | Disposition: A | Discharge status: Home / Self Care | Payer: Medicaid Other | Attending: Family Medicine | Admitting: Family Medicine

## 2022-01-29 DIAGNOSIS — N898 Other specified noninflammatory disorders of vagina: Secondary | ICD-10-CM | POA: Insufficient documentation

## 2022-01-29 LAB — URINALYSIS, ROUTINE W REFLEX MICROSCOPIC
Bilirubin Urine: NEGATIVE
Glucose, UA: NEGATIVE mg/dL
Hgb urine dipstick: NEGATIVE
Leukocytes,Ua: NEGATIVE
Nitrite: NEGATIVE
Specific Gravity, Urine: 1.025 (ref 1.005–1.030)
pH: 6 (ref 5.0–8.0)

## 2022-01-29 LAB — URINALYSIS, MICROSCOPIC (REFLEX)

## 2022-01-29 LAB — PREGNANCY, URINE: Preg Test, Ur: NEGATIVE

## 2022-01-29 LAB — WET PREP, GENITAL
Clue Cells Wet Prep HPF POC: NONE SEEN
Sperm: NONE SEEN
Trich, Wet Prep: NONE SEEN
WBC, Wet Prep HPF POC: <10 (ref <10)
Yeast Wet Prep HPF POC: NONE SEEN

## 2022-01-29 NOTE — Discharge Instructions (Addendum)
Your vaginal discharge is likely physiologic discharge which is normal vaginal discharge.  You do not have a yeast infection/vaginitis, bacterial vaginitis/BV or Trichomonas.  You elected to have STD testing performed today.  Someone will call you, if your test is positive.  Be sure to look in your MyChart for your results.

## 2022-01-29 NOTE — ED Triage Notes (Signed)
Pt reports she had a miscarriage in April, pt states she has been getting BV recurrent since then. C/o vaginal discharge x3 days, irritation x1 week. Pt has appt. With her OBGYN at the end of them month.

## 2022-01-29 NOTE — ED Provider Notes (Signed)
[facility]    CSN: [id number] Arrival date & time: 01/29/22  1354      History   Chief Complaint Chief Complaint  Patient presents with   Vaginal Discharge    HPI Dorothy Wallace is a 21 y.o. female.     Dorothy Wallace presents for vaginal irritation and discharge for the past 3 to 4 days.  States that the discharge is white and clumpy.  She tried Diflucan on 01/23/2022 and miconazole back in July.  States that she was treated for yeast infection and BV.  Notes that in April she had a miscarriage and has been getting recurrent BV since then. Denies odor, burning, abdominal pain, dysuria or hematuria or vomiting, fevers or pelvic pain. Endorses nausea. Uses Junel for birth control (OCPs).  She only has 1 partner.  Do not use condoms and her boyfriend pulls her out. Treated for STDs in the past and tested negative since. Patient denies douching.   Patient's last menstrual period was 01/04/2022 (exact date).   HPI  Past Medical History:  Diagnosis Date   Environmental allergies    History of chlamydia    History of gonorrhea    History of miscarriage     Patient Active Problem List   Diagnosis Date Noted   Miscarriage 09/15/2021   BV (bacterial vaginosis) 09/13/2021   Vaginal discharge 07/08/2021   Nipple discharge 07/08/2021   Well woman exam 07/08/2021   COVID-19 11/10/2019   Pregnancy of unknown anatomic location 08/23/2019   Chlamydia infection 10/09/2017   Seasonal allergic rhinitis due to pollen 10/08/2017   Dysthymia 01/21/2016   Oppositional defiant disorder 09/15/2013    History reviewed. No pertinent surgical history.  OB History   No obstetric history on file.      Home Medications    Prior to Admission medications   Medication Sig Start Date End Date Taking? Authorizing Provider  ferrous sulfate 325 (65 FE) MG tablet Take by mouth.   Yes [provider]  JUNEL FE 1/20 1-20 MG-MCG tablet Take 1 tablet by mouth daily. 09/15/21  Yes  [provider]  Multiple Vitamin (MULTI-VITAMIN PO) Take 1 tablet by mouth daily.   Yes [provider]  Multiple Vitamins-Minerals (KP ADULTS DAILY FORMULA) TABS Take by mouth. 01/10/18  Yes [provider]  cloNIDine (CATAPRES) 0.2 MG tablet Take by mouth. 01/21/16   [provider]  dexmethylphenidate (FOCALIN XR) 15 MG 24 hr capsule Take by mouth. 02/20/16   [provider]  fluconazole (DIFLUCAN) 150 MG tablet Take 1 tablet (150 mg total) by mouth daily. -For your yeast infection, start the Diflucan (fluconazole)- Take one pill today (day 1). If you're still having symptoms in 3 days, take the second pill. 01/23/22   Margaretann Loveless, PA-C  ibuprofen (ADVIL) 600 MG tablet Take 600 mg by mouth 3 (three) times daily. 09/01/21   [provider]  melatonin 3 MG TABS tablet Take by mouth. 10/05/15   [provider]  ondansetron (ZOFRAN-ODT) 4 MG disintegrating tablet Take 4 mg by mouth every 6 (six) hours as needed. 09/01/21   [provider]  oxyCODONE (OXY IR/ROXICODONE) 5 MG immediate release tablet Take by mouth. 09/01/21   [provider]  promethazine-dextromethorphan (PROMETHAZINE-DM) 6.25-15 MG/5ML syrup Take 5 mLs by mouth 3 (three) times daily as needed for cough. 07/10/21   Bing Neighbors, FNP    Family History Family History  Problem Relation Age of Onset   Stroke Mother  Multiple sclerosis Father     Social History Social History   Tobacco Use   Smoking status: Never   Smokeless tobacco: Never  Vaping Use   Vaping Use: Never used  Substance Use Topics   Alcohol use: Never   Drug use: Never     Allergies   Pollen extract   Review of Systems Review of Systems: :negative unless otherwise stated in HPI.      Physical Exam Triage Vital Signs ED Triage Vitals  Enc Vitals Group     BP 01/29/22 1410 126/72     Pulse Rate 01/29/22 1410 83     Resp --      Temp 01/29/22 1410 98 F  (36.7 C)     Temp Source 01/29/22 1410 Oral     SpO2 01/29/22 1410 100 %     Weight 01/29/22 1408 213 lb (96.6 kg)     Height 01/29/22 1408 5\' 9"  (1.753 m)     Head Circumference --      Peak Flow --      Pain Score 01/29/22 1408 0     Pain Loc --      Pain Edu? --      Excl. in GC? --    No data found.  Updated Vital Signs BP 126/72 (BP Location: Right Arm)   Pulse 83   Temp 98 F (36.7 C) (Oral)   Ht 5\' 9"  (1.753 m)   Wt 96.6 kg   LMP 01/04/2022 (Exact Date)   SpO2 100%   BMI 31.45 kg/m   Visual Acuity Right Eye Distance:   Left Eye Distance:   Bilateral Distance:    Right Eye Near:   Left Eye Near:    Bilateral Near:     Physical Exam GEN: well appearing female in no acute distress  CVS: well perfused  RESP: speaking in full sentences without pause, no respiratory distress  GU: deferred, patient performed self swab    UC Treatments / Results  Labs (all labs ordered are listed, but only abnormal results are displayed) Labs Reviewed  URINALYSIS, ROUTINE W REFLEX MICROSCOPIC - Abnormal; Notable for the following components:      Result Value   APPearance HAZY (*)    Ketones, ur TRACE (*)    Protein, ur TRACE (*)    All other components within normal limits  URINALYSIS, MICROSCOPIC (REFLEX) - Abnormal; Notable for the following components:   Bacteria, UA RARE (*)    All other components within normal limits  WET PREP, GENITAL  PREGNANCY, URINE  CERVICOVAGINAL ANCILLARY ONLY    EKG   Radiology No results found.  Procedures Procedures (including critical care time)  Medications Ordered in UC Medications - No data to display  Initial Impression / Assessment and Plan / UC Course  I have reviewed the triage vital signs and the nursing notes.  Pertinent labs & imaging results that were available during my care of the patient were reviewed by me and considered in my medical decision making (see chart for details).     Vaginal discharge Patient  is a 21 year old female who presents for 3 to 4 days of vaginal discharge.  Wet prep negative for BV, trichomoniasis, yeast infection.  Urinalysis did not show evidence of acute cystitis and there is no hematuria.  With shared decision-making she agreed for STI testing.  Of note, she had negative testing back in July.  Discussed physiologic vaginal discharge with patient. - F/U with PCP if symptoms  not improving or getting worse.  - Return precautions including abdominal pain, fever, chills, nausea, or vomiting given.    Final Clinical Impressions(s) / UC Diagnoses   Final diagnoses:  Vaginal discharge     Discharge Instructions      Your vaginal discharge is likely physiologic discharge which is normal vaginal discharge.  You do not have a yeast infection/vaginitis, bacterial vaginitis/BV or Trichomonas.  You elected to have STD testing performed today.  Someone will call you, if your test is positive.  Be sure to look in your MyChart for your results.       ED Prescriptions   None    PDMP not reviewed this encounter.   Katha Cabal, DO 01/29/22 1540

## 2022-01-30 LAB — CERVICOVAGINAL ANCILLARY ONLY
Chlamydia: NEGATIVE
Comment: NEGATIVE
Comment: NEGATIVE
Comment: NORMAL
Neisseria Gonorrhea: NEGATIVE
Trichomonas: NEGATIVE

## 2022-05-18 ENCOUNTER — Ambulatory Visit
Admission: EM | Admit: 2022-05-18 | Discharge: 2022-05-18 | Disposition: A | Discharge status: Home / Self Care | Payer: Medicaid Other | Attending: Nurse Practitioner | Admitting: Nurse Practitioner

## 2022-05-18 DIAGNOSIS — N760 Acute vaginitis: Secondary | ICD-10-CM

## 2022-05-18 DIAGNOSIS — Z113 Encounter for screening for infections with a predominantly sexual mode of transmission: Secondary | ICD-10-CM | POA: Diagnosis not present

## 2022-05-18 DIAGNOSIS — R350 Frequency of micturition: Secondary | ICD-10-CM | POA: Diagnosis not present

## 2022-05-18 LAB — POCT URINALYSIS DIP (MANUAL ENTRY)
Bilirubin, UA: NEGATIVE
Blood, UA: NEGATIVE
Glucose, UA: NEGATIVE mg/dL
Leukocytes, UA: NEGATIVE
Nitrite, UA: NEGATIVE
Spec Grav, UA: 1.025 (ref 1.010–1.025)
Urobilinogen, UA: 1 E.U./dL
pH, UA: 6 (ref 5.0–8.0)

## 2022-05-18 LAB — POCT URINE PREGNANCY: Preg Test, Ur: NEGATIVE

## 2022-05-18 MED ORDER — FLUCONAZOLE 150 MG PO TABS: 150.0000 mg | ORAL_TABLET | Freq: Every day | ORAL | 0 refills | Status: AC

## 2022-05-18 NOTE — Discharge Instructions (Addendum)
Clinical contact you with results of the testing done today is positive Follow-up with your PCP if symptoms do not improve Please go to the ER for any worsening symptoms I hope you feel better soon!

## 2022-05-18 NOTE — ED Provider Notes (Signed)
[facility]    CSN: [id number] Arrival date & time: 05/18/22  1437      History   Chief Complaint Chief Complaint  Patient presents with   Vaginal Discharge    HPI Dorothy Wallace is a 21 y.o. female for evaluation of multiple complaints.  Patient reports 2 weeks of a vaginal discharge that is thicker than normal and slightly malodorous but nonpruritic or clumpy.  Reports history of yeast infections and BV in the past and states this feels more like a yeast infection.  She also reports urinary frequency but denies urgency, dysuria, hematuria, fevers, abd pain, nausea/vomiting, flank pain.  Denies any known STD exposure but would like STD screening as she has a new sexual partner.  In addition she had a tattoo to her left outer thigh 2 weeks ago as well as to her chest.  She reports the 1 on her thigh seems to be infected and is concerned.  Denies any swelling, drainage, warmth, erythema.  No other concerns at this time.   Vaginal Discharge   Past Medical History:  Diagnosis Date   Environmental allergies    History of chlamydia    History of gonorrhea    History of miscarriage     Patient Active Problem List   Diagnosis Date Noted   Miscarriage 09/15/2021   BV (bacterial vaginosis) 09/13/2021   Vaginal discharge 07/08/2021   Nipple discharge 07/08/2021   Well woman exam 07/08/2021   COVID-19 11/10/2019   Pregnancy of unknown anatomic location 08/23/2019   Chlamydia infection 10/09/2017   Seasonal allergic rhinitis due to pollen 10/08/2017   Dysthymia 01/21/2016   Oppositional defiant disorder 09/15/2013    History reviewed. No pertinent surgical history.  OB History   No obstetric history on file.      Home Medications    Prior to Admission medications   Medication Sig Start Date End Date Taking? Authorizing Provider  fluconazole (DIFLUCAN) 150 MG tablet Take 1 tablet (150 mg total) by mouth daily for 2 doses. May repeat dose in 3 days if symptoms  persist 05/18/22 05/20/22 Yes Radford Pax, NP  cloNIDine (CATAPRES) 0.2 MG tablet Take by mouth. 01/21/16   [provider]  dexmethylphenidate (FOCALIN XR) 15 MG 24 hr capsule Take by mouth. 02/20/16   [provider]  ferrous sulfate 325 (65 FE) MG tablet Take by mouth.    [provider]  ibuprofen (ADVIL) 600 MG tablet Take 600 mg by mouth 3 (three) times daily. 09/01/21   [provider]  JUNEL FE 1/20 1-20 MG-MCG tablet Take 1 tablet by mouth daily. 09/15/21   [provider]  melatonin 3 MG TABS tablet Take by mouth. 10/05/15   [provider]  Multiple Vitamin (MULTI-VITAMIN PO) Take 1 tablet by mouth daily.    [provider]  Multiple Vitamins-Minerals (KP ADULTS DAILY FORMULA) TABS Take by mouth. 01/10/18   [provider]  ondansetron (ZOFRAN-ODT) 4 MG disintegrating tablet Take 4 mg by mouth every 6 (six) hours as needed. 09/01/21   [provider]  oxyCODONE (OXY IR/ROXICODONE) 5 MG immediate release tablet Take by mouth. 09/01/21   [provider]  promethazine-dextromethorphan (PROMETHAZINE-DM) 6.25-15 MG/5ML syrup Take 5 mLs by mouth 3 (three) times daily as needed for cough. 07/10/21   Bing Neighbors, FNP    Family History Family History  Problem Relation Age of Onset   Stroke Mother    Multiple sclerosis Father     Social  History Social History   Tobacco Use   Smoking status: Never   Smokeless tobacco: Never  Vaping Use   Vaping Use: Never used  Substance Use Topics   Alcohol use: Never   Drug use: Never     Allergies   Pollen extract   Review of Systems Review of Systems  Genitourinary:  Positive for vaginal discharge.  Skin:        Tattoo infection     Physical Exam Triage Vital Signs ED Triage Vitals  Enc Vitals Group     BP 05/18/22 1447 124/84     Pulse Rate 05/18/22 1447 83     Resp 05/18/22 1447 16     Temp 05/18/22 1447 (!) 97.4 F (36.3 C)     Temp  Source 05/18/22 1447 Oral     SpO2 05/18/22 1447 98 %     Weight --      Height --      Head Circumference --      Peak Flow --      Pain Score 05/18/22 1448 3     Pain Loc --      Pain Edu? --      Excl. in [place]? --    No data found.  Updated Vital Signs BP 124/84 (BP Location: Left Arm)   Pulse 83   Temp (!) 97.4 F (36.3 C) (Oral)   Resp 16   LMP 05/07/2022   SpO2 98%   Visual Acuity Right Eye Distance:   Left Eye Distance:   Bilateral Distance:    Right Eye Near:   Left Eye Near:    Bilateral Near:     Physical Exam Vitals and nursing note reviewed.  Constitutional:      Appearance: Normal appearance.  HENT:     Head: Normocephalic and atraumatic.  Eyes:     Pupils: Pupils are equal, round, and reactive to light.  Cardiovascular:     Rate and Rhythm: Normal rate.  Pulmonary:     Effort: Pulmonary effort is normal.  Abdominal:     Tenderness: There is no right CVA tenderness or left CVA tenderness.  Genitourinary:    Comments: Deferred as pt self swabbed Skin:    General: Skin is warm and dry.       Neurological:     General: No focal deficit present.     Mental Status: She is alert and oriented to person, place, and time.  Psychiatric:        Mood and Affect: Mood normal.        Behavior: Behavior normal.      UC Treatments / Results  Labs (all labs ordered are listed, but only abnormal results are displayed) Labs Reviewed  POCT URINALYSIS DIP (MANUAL ENTRY) - Abnormal; Notable for the following components:      Result Value   Color, UA other (*)    Clarity, UA cloudy (*)    Ketones, POC UA small (15) (*)    Protein Ur, POC trace (*)    All other components within normal limits  URINE CULTURE  POCT URINE PREGNANCY  CERVICOVAGINAL ANCILLARY ONLY    EKG   Radiology No results found.  Procedures Procedures (including critical care time)  Medications Ordered in UC Medications - No data to display  Initial Impression / Assessment  and Plan / UC Course  I have reviewed the triage vital signs and the nursing notes.  Pertinent labs & imaging results that were available during  my care of the patient were reviewed by me and considered in my medical decision making (see chart for details).     Will send urine culture Vaginal swab and will start Diflucan given symptoms with additional treatment pending results Increase fluids Discussed patient that keloid formation over tattoo and it is not infected. Patient to follow-up with GYN as scheduled appointment next week ER precautions reviewed and pt verbalized understanding Final Clinical Impressions(s) / UC Diagnoses   Final diagnoses:  Urinary frequency  Acute vaginitis  Screening examination for STD (sexually transmitted disease)     Discharge Instructions      Clinical contact you with results of the testing done today is positive Follow-up with your PCP if symptoms do not improve Please go to the ER for any worsening symptoms I hope you feel better soon!   ED Prescriptions     Medication Sig Dispense Auth. Provider   fluconazole (DIFLUCAN) 150 MG tablet Take 1 tablet (150 mg total) by mouth daily for 2 doses. May repeat dose in 3 days if symptoms persist 2 tablet Radford Pax, NP      PDMP not reviewed this encounter.   Radford Pax, NP 05/18/22 1520

## 2022-05-18 NOTE — ED Triage Notes (Signed)
Pt c/o vaginal d/c x 2 weeks, urinary freq x 1 week-also c/o possible infection to tattoo left thigh-NAD-steady gait

## 2022-05-19 LAB — CERVICOVAGINAL ANCILLARY ONLY
Bacterial Vaginitis (gardnerella): POSITIVE — AB
Candida Glabrata: NEGATIVE
Candida Vaginitis: NEGATIVE
Chlamydia: POSITIVE — AB
Comment: NEGATIVE
Comment: NEGATIVE
Comment: NEGATIVE
Comment: NEGATIVE
Comment: NEGATIVE
Comment: NORMAL
Neisseria Gonorrhea: NEGATIVE
Trichomonas: NEGATIVE

## 2022-05-19 LAB — URINE CULTURE: Culture: NO GROWTH

## 2022-05-20 ENCOUNTER — Telehealth: Payer: Self-pay | Admitting: Emergency Medicine

## 2022-05-20 DIAGNOSIS — A749 Chlamydial infection, unspecified: Secondary | ICD-10-CM

## 2022-05-20 DIAGNOSIS — B9689 Other specified bacterial agents as the cause of diseases classified elsewhere: Secondary | ICD-10-CM

## 2022-05-20 MED ORDER — METRONIDAZOLE 0.75 % VA GEL: VAGINAL | 0 refills | Status: DC

## 2022-05-20 MED ORDER — CLINDAMYCIN HCL 300 MG PO CAPS: 300.0000 mg | ORAL_CAPSULE | Freq: Two times a day (BID) | ORAL | 0 refills | Status: AC

## 2022-05-20 MED ORDER — DOXYCYCLINE HYCLATE 100 MG PO TABS: 100.0000 mg | ORAL_TABLET | Freq: Two times a day (BID) | ORAL | 0 refills | Status: AC

## 2022-05-20 NOTE — Telephone Encounter (Signed)
Patient seen in urgent care on May 18, 2022.  Patient tested positive for bacterial vaginosis and chlamydia.  Patient has a history of frequent episodes of bacterial vaginosis in the past 6 months.  We will treat patient aggressively with dual therapy including Cleocin 300 mg twice daily and metronidazole vaginal gel nightly x5 days.  Patient provided with a 7-day course of doxycycline 100 mg twice daily for chlamydia treatment.

## 2022-06-23 ENCOUNTER — Encounter (HOSPITAL_COMMUNITY): Payer: Self-pay | Admitting: Emergency Medicine

## 2022-06-23 ENCOUNTER — Ambulatory Visit (HOSPITAL_COMMUNITY)
Admission: EM | Admit: 2022-06-23 | Discharge: 2022-06-23 | Disposition: A | Discharge status: Home / Self Care | Payer: Medicaid Other | Attending: Urgent Care | Admitting: Urgent Care

## 2022-06-23 DIAGNOSIS — B3731 Acute candidiasis of vulva and vagina: Secondary | ICD-10-CM | POA: Diagnosis not present

## 2022-06-23 MED ORDER — FLUCONAZOLE 150 MG PO TABS
ORAL_TABLET | ORAL | 0 refills | Status: DC
Start: 2022-06-23 — End: 2022-10-06

## 2022-06-23 NOTE — ED Triage Notes (Signed)
Pt reports was treated in November for chlamydia. Reports after taking medication vaginal discharge become clumpy. Reports still that way and have itching.

## 2022-06-23 NOTE — ED Provider Notes (Signed)
[facility]    CSN: [id number] Arrival date & time: 06/23/22  1106      History   Chief Complaint Chief Complaint  Patient presents with   Vaginal Discharge    HPI Dorothy Wallace is a 22 y.o. female.   Pt denies urinary sx. Recently had menses. No pelvic pain.  The history is provided by the patient.  Vaginal Discharge Quality:  White and thick Severity:  Moderate Onset quality:  Gradual Duration:  2 weeks Timing:  Intermittent Progression:  Worsening Chronicity:  New Context: recent antibiotic use   Relieved by:  Nothing Ineffective treatments:  OTC medications (tried 7 day monistat with no relief) Risk factors: STI (pt tx for STI in Nov, took all meds as prescribed. Sx resolved, but new discharge with slight itch occurred)     Past Medical History:  Diagnosis Date   Environmental allergies    History of chlamydia    History of gonorrhea    History of miscarriage     Patient Active Problem List   Diagnosis Date Noted   Miscarriage 09/15/2021   BV (bacterial vaginosis) 09/13/2021   Vaginal discharge 07/08/2021   Nipple discharge 07/08/2021   Well woman exam 07/08/2021   COVID-19 11/10/2019   Pregnancy of unknown anatomic location 08/23/2019   Chlamydia infection 10/09/2017   Seasonal allergic rhinitis due to pollen 10/08/2017   Dysthymia 01/21/2016   Oppositional defiant disorder 09/15/2013    History reviewed. No pertinent surgical history.  OB History   No obstetric history on file.      Home Medications    Prior to Admission medications   Medication Sig Start Date End Date Taking? Authorizing Provider  fluconazole (DIFLUCAN) 150 MG tablet Take one tab PO once today. Repeat in 72 hours if symptoms persist 06/23/22  Yes Benny Henrie L, PA  ibuprofen (ADVIL) 600 MG tablet Take 600 mg by mouth 3 (three) times daily. 09/01/21   [provider]  JUNEL FE 1/20 1-20 MG-MCG tablet Take 1 tablet by mouth daily. 09/15/21   [provider]  Multiple Vitamin (MULTI-VITAMIN PO) Take 1 tablet by mouth daily.    [provider]  Multiple Vitamins-Minerals (KP ADULTS DAILY FORMULA) TABS Take by mouth. 01/10/18   [provider]    Family History Family History  Problem Relation Age of Onset   Stroke Mother    Multiple sclerosis Father     Social History Social History   Tobacco Use   Smoking status: Never   Smokeless tobacco: Never  Vaping Use   Vaping Use: Never used  Substance Use Topics   Alcohol use: Never   Drug use: Never     Allergies   Pollen extract   Review of Systems Review of Systems  Genitourinary:  Positive for vaginal discharge.     Physical Exam Triage Vital Signs ED Triage Vitals  Enc Vitals Group     BP 06/23/22 1318 128/75     Pulse Rate 06/23/22 1318 77     Resp 06/23/22 1318 16     Temp 06/23/22 1318 99 F (37.2 C)     Temp Source 06/23/22 1318 Oral     SpO2 06/23/22 1318 100 %     Weight --      Height --      Head Circumference --      Peak Flow --      Pain Score 06/23/22 1316 0     Pain Loc --  Pain Edu? --      Excl. in [place]? --    No data found.  Updated Vital Signs BP 128/75 (BP Location: Right Arm)   Pulse 77   Temp 99 F (37.2 C) (Oral)   Resp 16   LMP 06/03/2022   SpO2 100%   Visual Acuity Right Eye Distance:   Left Eye Distance:   Bilateral Distance:    Right Eye Near:   Left Eye Near:    Bilateral Near:     Physical Exam Vitals and nursing note reviewed. Chaperone present: deferred.  Constitutional:      Appearance: Normal appearance. She is well-developed and normal weight.  HENT:     Head: Normocephalic.  Cardiovascular:     Rate and Rhythm: Normal rate.     Heart sounds: No murmur heard. Pulmonary:     Effort: Pulmonary effort is normal. No respiratory distress.     Breath sounds: No wheezing.  Abdominal:     General: Abdomen is flat. Bowel sounds are normal. There is no distension. There are no  signs of injury.     Palpations: Abdomen is soft. There is no shifting dullness, fluid wave, hepatomegaly, splenomegaly, mass or pulsatile mass.     Tenderness: There is no abdominal tenderness. There is no right CVA tenderness, left CVA tenderness, guarding or rebound. Negative signs include Murphy's sign, Rovsing's sign, McBurney's sign, psoas sign and obturator sign.     Hernia: No hernia is present.  Genitourinary:    General: Normal vulva.     Pubic Area: No rash or pubic lice.      Labia:        Right: No rash, tenderness, lesion or injury.        Left: No rash, tenderness, lesion or injury.      Urethra: No prolapse, urethral pain, urethral swelling or urethral lesion.     Vagina: No signs of injury and foreign body. Vaginal discharge (thick, white, clumpy) present. No erythema, tenderness, bleeding, lesions or prolapsed vaginal walls.     Cervix: No cervical motion tenderness, discharge, friability, lesion, erythema or eversion.     Uterus: Normal. Not deviated, not enlarged, not fixed, not tender and no uterine prolapse.      Adnexa: Right adnexa normal and left adnexa normal.       Right: No mass, tenderness or fullness.         Left: No mass, tenderness or fullness.       Rectum: Normal.  Neurological:     Mental Status: She is alert.      UC Treatments / Results  Labs (all labs ordered are listed, but only abnormal results are displayed) Labs Reviewed  CERVICOVAGINAL ANCILLARY ONLY    EKG   Radiology No results found.  Procedures Procedures (including critical care time)  Medications Ordered in UC Medications - No data to display  Initial Impression / Assessment and Plan / UC Course  I have reviewed the triage vital signs and the nursing notes.  Pertinent labs & imaging results that were available during my care of the patient were reviewed by me and considered in my medical decision making (see chart for details).     Vaginal yeast infection -Aptima swab  collected.  Clinical appearance suggestive of vaginal yeast infection.  Will do single Diflucan today, repeat in 72 hours if symptoms persist.  Will call with results of Aptima should additional treatments be warranted.   Final Clinical Impressions(s) / UC Diagnoses  Final diagnoses:  Vaginal yeast infection     Discharge Instructions      You have a vaginal yeast infection, likely secondary to the antibiotics you recently took. Please take 1 Diflucan tablet today. You may take the second one in 72 hours We will call with results of your Aptima swab should any additional treatment be required    ED Prescriptions     Medication Sig Dispense Auth. Provider   fluconazole (DIFLUCAN) 150 MG tablet Take one tab PO once today. Repeat in 72 hours if symptoms persist 2 tablet Astaria Nanez L, PA      PDMP not reviewed this encounter.   Chaney Malling, [place] 06/23/22 1503

## 2022-06-23 NOTE — Discharge Instructions (Signed)
You have a vaginal yeast infection, likely secondary to the antibiotics you recently took. Please take 1 Diflucan tablet today. You may take the second one in 72 hours We will call with results of your Aptima swab should any additional treatment be required

## 2022-06-26 ENCOUNTER — Telehealth (HOSPITAL_COMMUNITY): Payer: Self-pay | Admitting: Emergency Medicine

## 2022-06-26 LAB — CERVICOVAGINAL ANCILLARY ONLY
Bacterial Vaginitis (gardnerella): POSITIVE — AB
Candida Glabrata: NEGATIVE
Candida Vaginitis: NEGATIVE
Chlamydia: NEGATIVE
Comment: NEGATIVE
Comment: NEGATIVE
Comment: NEGATIVE
Comment: NEGATIVE
Comment: NEGATIVE
Comment: NORMAL
Neisseria Gonorrhea: NEGATIVE
Trichomonas: NEGATIVE

## 2022-06-26 MED ORDER — METRONIDAZOLE 500 MG PO TABS: 500.0000 mg | ORAL_TABLET | Freq: Two times a day (BID) | ORAL | 0 refills | Status: DC

## 2022-07-31 ENCOUNTER — Ambulatory Visit (HOSPITAL_COMMUNITY)
Admission: RE | Admit: 2022-07-31 | Discharge: 2022-07-31 | Disposition: A | Discharge status: Home / Self Care | Payer: Medicaid Other | Source: Ambulatory Visit | Attending: Internal Medicine | Admitting: Internal Medicine

## 2022-07-31 ENCOUNTER — Other Ambulatory Visit: Payer: Self-pay

## 2022-07-31 ENCOUNTER — Encounter (HOSPITAL_COMMUNITY): Payer: Self-pay

## 2022-07-31 VITALS — BP 116/79 | HR 73 | Temp 98.7°F | Resp 18

## 2022-07-31 DIAGNOSIS — Z202 Contact with and (suspected) exposure to infections with a predominantly sexual mode of transmission: Secondary | ICD-10-CM | POA: Diagnosis not present

## 2022-07-31 NOTE — Discharge Instructions (Signed)
Your STD testing has been sent to the lab and will come back in the next 2 to 3 days.  We will call you if any of your results are positive requiring treatment and treat you at that time.   If you do not receive a phone call from [place], this means your testing was negative.  Avoid sexual intercourse until your STD results come back.  If any of your STD results are positive, you will need to avoid sexual intercourse for 7 days while you are being treated to prevent spread of STD.  Condom use is the best way to prevent spread of STDs.  Return to urgent care as needed.

## 2022-07-31 NOTE — ED Triage Notes (Signed)
PT was notified by a previous partner to get checked for herpes. Pt also reports no improvement with BV.  Pt last seen 06-23-22

## 2022-08-02 NOTE — ED Provider Notes (Signed)
[facility]    CSN: [id number] Arrival date & time: 07/31/22  1130      History   Chief Complaint Chief Complaint  Patient presents with   Exposure to STD    A person informed me that I needed to get tested for herpes because they were exposed to it I've also had slight irritation and burning could be a yeast infection or BV, - Entered by patient    HPI Dorothy Wallace is a 22 y.o. female.   Patient presents to urgent care for evaluation after finding out that a recent sexual partner recently developed herpes outbreak. Patient participated in protected sexual intercourse with female partner and he did not have a herpes outbreak at the time of intercourse. Condom was worn entire time. Patient is very concerned she may have been exposed to HSV-2. She does not have history of genital herpes. She is not currently experiencing any rash to the vaginal area or mouth. She also states she has slight vaginal itching and thin white discharge without odor. She was treated for bacterial vaginitis in early January 2024 (1 month ago) and states the medication helped, but when she stopped taking the flagyl, her symptoms returned. She states she does wear tight fitting clothing. She is not a diabetic and is not on an SGLT-2 inhibitor. She does not douche and washes with soap and water. No recent changes in bath or personal hygiene products. Currently on menstrual cycle.    Exposure to STD    Past Medical History:  Diagnosis Date   Environmental allergies    History of chlamydia    History of gonorrhea    History of miscarriage     Patient Active Problem List   Diagnosis Date Noted   Miscarriage 09/15/2021   BV (bacterial vaginosis) 09/13/2021   Vaginal discharge 07/08/2021   Nipple discharge 07/08/2021   Well woman exam 07/08/2021   COVID-19 11/10/2019   Pregnancy of unknown anatomic location 08/23/2019   Chlamydia infection 10/09/2017   Seasonal allergic rhinitis due to pollen  10/08/2017   Dysthymia 01/21/2016   Oppositional defiant disorder 09/15/2013    History reviewed. No pertinent surgical history.  OB History   No obstetric history on file.      Home Medications    Prior to Admission medications   Medication Sig Start Date End Date Taking? Authorizing Provider  fluconazole (DIFLUCAN) 150 MG tablet Take one tab PO once today. Repeat in 72 hours if symptoms persist 06/23/22   Crain, Whitney L, PA  ibuprofen (ADVIL) 600 MG tablet Take 600 mg by mouth 3 (three) times daily. 09/01/21   [provider]  JUNEL FE 1/20 1-20 MG-MCG tablet Take 1 tablet by mouth daily. 09/15/21   [provider]  metroNIDAZOLE (FLAGYL) 500 MG tablet Take 1 tablet (500 mg total) by mouth 2 (two) times daily. 06/26/22   Chase Picket, MD  Multiple Vitamin (MULTI-VITAMIN PO) Take 1 tablet by mouth daily.    [provider]  Multiple Vitamins-Minerals (KP ADULTS DAILY FORMULA) TABS Take by mouth. 01/10/18   [provider]    Family History Family History  Problem Relation Age of Onset   Stroke Mother    Multiple sclerosis Father     Social History Social History   Tobacco Use   Smoking status: Never   Smokeless tobacco: Never  Vaping Use   Vaping Use: Never used  Substance Use Topics   Alcohol use: Never   Drug use:  Never     Allergies   Pollen extract   Review of Systems Review of Systems Per HPI  Physical Exam Triage Vital Signs ED Triage Vitals  Enc Vitals Group     BP 07/31/22 1141 116/79     Pulse Rate 07/31/22 1141 73     Resp 07/31/22 1141 18     Temp 07/31/22 1141 98.7 F (37.1 C)     Temp src --      SpO2 07/31/22 1141 99 %     Weight --      Height --      Head Circumference --      Peak Flow --      Pain Score 07/31/22 1140 0     Pain Loc --      Pain Edu? --      Excl. in [place]? --    No data found.  Updated Vital Signs BP 116/79   Pulse 73   Temp 98.7 F (37.1 C)   Resp 18   LMP 07/31/2022    SpO2 99%   Visual Acuity Right Eye Distance:   Left Eye Distance:   Bilateral Distance:    Right Eye Near:   Left Eye Near:    Bilateral Near:     Physical Exam Vitals and nursing note reviewed.  Constitutional:      Appearance: She is not ill-appearing or toxic-appearing.  HENT:     Head: Normocephalic and atraumatic.     Right Ear: Hearing and external ear normal.     Left Ear: Hearing and external ear normal.     Nose: Nose normal.     Mouth/Throat:     Lips: Pink.  Eyes:     General: Lids are normal. Vision grossly intact. Gaze aligned appropriately.     Extraocular Movements: Extraocular movements intact.     Conjunctiva/sclera: Conjunctivae normal.  Cardiovascular:     Rate and Rhythm: Normal rate and regular rhythm.     Heart sounds: Normal heart sounds, S1 normal and S2 normal.  Pulmonary:     Effort: Pulmonary effort is normal. No respiratory distress.     Breath sounds: Normal breath sounds and air entry.  Genitourinary:    Comments: Deferred. Musculoskeletal:     Cervical back: Neck supple.  Skin:    General: Skin is warm and dry.     Capillary Refill: Capillary refill takes less than 2 seconds.     Findings: No rash.  Neurological:     General: No focal deficit present.     Mental Status: She is alert and oriented to person, place, and time. Mental status is at baseline.     Cranial Nerves: No dysarthria or facial asymmetry.  Psychiatric:        Mood and Affect: Mood normal.        Speech: Speech normal.        Behavior: Behavior normal.        Thought Content: Thought content normal.        Judgment: Judgment normal.      UC Treatments / Results  Labs (all labs ordered are listed, but only abnormal results are displayed) Labs Reviewed  CERVICOVAGINAL ANCILLARY ONLY    EKG   Radiology No results found.  Procedures Procedures (including critical care time)  Medications Ordered in UC Medications - No data to display  Initial  Impression / Assessment and Plan / UC Course  I have reviewed the triage vital signs and  the nursing notes.  Pertinent labs & imaging results that were available during my care of the patient were reviewed by me and considered in my medical decision making (see chart for details).   1. Possible exposure to STD STI labs pending.  Patient declines HIV and syphilis testing today.  Will notify patient of positive results and treat accordingly when labs come back.  Patient to avoid sexual intercourse until screening testing comes back.  Education provided regarding safe sexual practices and patient encouraged to continue to use protection to prevent spread of STIs.   Patient is likely low risk for exposure/development of HSV-2 given condom was used and partner did not have an outbreak at time of intercourse. Discussed symptoms of genital herpes and what to watch out for. She may return to urgent care for HSV culture and typing should she develop outbreak.   Discussed physical exam and available lab work findings in clinic with patient.  Counseled patient regarding appropriate use of medications and potential side effects for all medications recommended or prescribed today. Discussed red flag signs and symptoms of worsening condition,when to call the PCP office, return to urgent care, and when to seek higher level of care in the emergency department. Patient verbalizes understanding and agreement with plan. All questions answered. Patient discharged in stable condition.    Final Clinical Impressions(s) / UC Diagnoses   Final diagnoses:  Possible exposure to STD     Discharge Instructions      Your STD testing has been sent to the lab and will come back in the next 2 to 3 days.  We will call you if any of your results are positive requiring treatment and treat you at that time.   If you do not receive a phone call from [place], this means your testing was negative.  Avoid sexual intercourse until  your STD results come back.  If any of your STD results are positive, you will need to avoid sexual intercourse for 7 days while you are being treated to prevent spread of STD.  Condom use is the best way to prevent spread of STDs.  Return to urgent care as needed.     ED Prescriptions   None    PDMP not reviewed this encounter.   Talbot Grumbling, [place] 08/02/22 2141

## 2022-08-03 LAB — CERVICOVAGINAL ANCILLARY ONLY
Bacterial Vaginitis (gardnerella): POSITIVE — AB
Candida Glabrata: NEGATIVE
Candida Vaginitis: NEGATIVE
Chlamydia: NEGATIVE
Comment: NEGATIVE
Comment: NEGATIVE
Comment: NEGATIVE
Comment: NEGATIVE
Comment: NEGATIVE
Comment: NORMAL
Neisseria Gonorrhea: NEGATIVE
Trichomonas: NEGATIVE

## 2022-08-07 ENCOUNTER — Telehealth (HOSPITAL_COMMUNITY): Payer: Self-pay | Admitting: Emergency Medicine

## 2022-08-07 MED ORDER — METRONIDAZOLE 500 MG PO TABS: 500.0000 mg | ORAL_TABLET | Freq: Two times a day (BID) | ORAL | 0 refills | Status: DC

## 2022-08-13 ENCOUNTER — Ambulatory Visit
Admission: EM | Admit: 2022-08-13 | Discharge: 2022-08-13 | Disposition: A | Discharge status: Home / Self Care | Payer: Medicaid Other | Attending: Nurse Practitioner | Admitting: Nurse Practitioner

## 2022-08-13 ENCOUNTER — Ambulatory Visit (INDEPENDENT_AMBULATORY_CARE_PROVIDER_SITE_OTHER): Payer: Medicaid Other

## 2022-08-13 DIAGNOSIS — K5900 Constipation, unspecified: Secondary | ICD-10-CM

## 2022-08-13 DIAGNOSIS — R112 Nausea with vomiting, unspecified: Secondary | ICD-10-CM

## 2022-08-13 LAB — POCT URINE PREGNANCY: Preg Test, Ur: NEGATIVE

## 2022-08-13 NOTE — Discharge Instructions (Signed)
Please go to the ER for further evaluation of your symptoms

## 2022-08-13 NOTE — ED Triage Notes (Signed)
Pt c/o of constipation since last week, two days V - bile pt states she is unable to keep anything down, pt is unable to pass gas, c/o dizzy/lightheaded with standing up too fast. Home remedies: Laxative and prune juice without success.

## 2022-08-13 NOTE — ED Provider Notes (Signed)
[facility]    CSN: [id number] Arrival date & time: 08/13/22  1033      History   Chief Complaint Chief Complaint  Patient presents with   Constipation    HPI Dorothy Wallace is a 22 y.o. female presents for evaluation of constipation and abdominal pain.  Patient reports 1 week of constipation stating her last bowel movement was 1 week ago and was normal.  She states she normally goes every 2 to 3 days.  She did take some OTC laxatives and prune juice on 1 day without any improvement.  She states she used this in the past with success.  She states yesterday she developed vomiting (yellow to green) and states she is unable to pass gas.  Thinks she last passed gas 3 days ago.  Denies bloating or abdominal firmness.  No dysuria.  Cannot hold any food or fluids down..  No fevers or chills.  No URI symptoms.  No history abdominal obstructions or surgeries in past.  No other concerns at this time.   Constipation Associated symptoms: abdominal pain and vomiting     Past Medical History:  Diagnosis Date   Environmental allergies    History of chlamydia    History of gonorrhea    History of miscarriage     Patient Active Problem List   Diagnosis Date Noted   Miscarriage 09/15/2021   BV (bacterial vaginosis) 09/13/2021   Vaginal discharge 07/08/2021   Nipple discharge 07/08/2021   Well woman exam 07/08/2021   COVID-19 11/10/2019   Pregnancy of unknown anatomic location 08/23/2019   Chlamydia infection 10/09/2017   Seasonal allergic rhinitis due to pollen 10/08/2017   Dysthymia 01/21/2016   Oppositional defiant disorder 09/15/2013    History reviewed. No pertinent surgical history.  OB History   No obstetric history on file.      Home Medications    Prior to Admission medications   Medication Sig Start Date End Date Taking? Authorizing Provider  fluconazole (DIFLUCAN) 150 MG tablet Take one tab PO once today. Repeat in 72 hours if symptoms persist 06/23/22    Crain, Whitney L, PA  ibuprofen (ADVIL) 600 MG tablet Take 600 mg by mouth 3 (three) times daily. 09/01/21   [provider]  JUNEL FE 1/20 1-20 MG-MCG tablet Take 1 tablet by mouth daily. 09/15/21   [provider]  metroNIDAZOLE (FLAGYL) 500 MG tablet Take 1 tablet (500 mg total) by mouth 2 (two) times daily. 08/07/22   Chase Picket, MD  Multiple Vitamin (MULTI-VITAMIN PO) Take 1 tablet by mouth daily.    [provider]  Multiple Vitamins-Minerals (KP ADULTS DAILY FORMULA) TABS Take by mouth. 01/10/18   [provider]    Family History Family History  Problem Relation Age of Onset   Stroke Mother    Multiple sclerosis Father     Social History Social History   Tobacco Use   Smoking status: Never   Smokeless tobacco: Never  Vaping Use   Vaping Use: Never used  Substance Use Topics   Alcohol use: Never   Drug use: Never     Allergies   Pollen extract   Review of Systems Review of Systems  Gastrointestinal:  Positive for abdominal pain, constipation and vomiting.     Physical Exam Triage Vital Signs ED Triage Vitals  Enc Vitals Group     BP 08/13/22 1044 107/72     Pulse Rate 08/13/22 1044 82     Resp 08/13/22 1044  18     Temp 08/13/22 1044 97.9 F (36.6 C)     Temp Source 08/13/22 1044 Oral     SpO2 08/13/22 1044 98 %     Weight --      Height --      Head Circumference --      Peak Flow --      Pain Score 08/13/22 1042 4     Pain Loc --      Pain Edu? --      Excl. in [place]? --    No data found.  Updated Vital Signs BP 107/72 (BP Location: Right Arm)   Pulse 82   Temp 97.9 F (36.6 C) (Oral)   Resp 18   LMP 07/31/2022   SpO2 98%   Visual Acuity Right Eye Distance:   Left Eye Distance:   Bilateral Distance:    Right Eye Near:   Left Eye Near:    Bilateral Near:     Physical Exam Vitals and nursing note reviewed.  Constitutional:      General: She is not in acute distress.    Appearance: Normal  appearance. She is obese. She is not ill-appearing.  Eyes:     Pupils: Pupils are equal, round, and reactive to light.  Cardiovascular:     Rate and Rhythm: Normal rate.  Pulmonary:     Effort: Pulmonary effort is normal.  Abdominal:     General: Bowel sounds are normal. There is no distension.     Palpations: Abdomen is soft. There is no hepatomegaly or splenomegaly.     Tenderness: There is abdominal tenderness in the right upper quadrant and left lower quadrant. There is no right CVA tenderness, left CVA tenderness or guarding. Negative signs include Rovsing's sign and McBurney's sign.  Skin:    General: Skin is warm and dry.  Neurological:     General: No focal deficit present.     Mental Status: She is alert and oriented to person, place, and time.  Psychiatric:        Mood and Affect: Mood normal.        Behavior: Behavior normal.      UC Treatments / Results  Labs (all labs ordered are listed, but only abnormal results are displayed) Labs Reviewed  POCT URINE PREGNANCY    EKG   Radiology DG Abd 1 View  Result Date: 08/13/2022 CLINICAL DATA:  22 year old female with constipation is not passing gas. EXAM: ABDOMEN - 1 VIEW COMPARISON:  None Available. FINDINGS: Standing views of the abdomen and pelvis at 1110 hours. Non obstructed bowel gas pattern. No evidence of pneumoperitoneum. Abundant retained stool throughout the abdomen and pelvis. Other abdominal and pelvic visceral contours appear negative. Incidental nipple piercings. Mild to moderate dextroconvex lumbar scoliosis. No acute osseous abnormality identified. IMPRESSION: Non obstructed bowel gas pattern with abundant retained stool throughout the abdomen and pelvis, compatible with constipation. Electronically Signed   By: Genevie Ann M.D.   On: 08/13/2022 11:16    Procedures Procedures (including critical care time)  Medications Ordered in UC Medications - No data to display  Initial Impression / Assessment and  Plan / UC Course  I have reviewed the triage vital signs and the nursing notes.  Pertinent labs & imaging results that were available during my care of the patient were reviewed by me and considered in my medical decision making (see chart for details).     Reviewed exam and symptoms with patient. X-ray confirms constipation  but given patient symptoms of vomiting, not passing gas and constipation concern for possible obstruction.  Advise she go to the emergency room for further evaluation.  She is in agreement plan will go POV to the emergency room. Final Clinical Impressions(s) / UC Diagnoses   Final diagnoses:  Constipation, unspecified constipation type  Nausea and vomiting, unspecified vomiting type   Discharge Instructions   None    ED Prescriptions   None    PDMP not reviewed this encounter.   Melynda Ripple, NP 08/13/22 1126

## 2022-10-06 ENCOUNTER — Ambulatory Visit
Admission: EM | Admit: 2022-10-06 | Discharge: 2022-10-06 | Disposition: A | Discharge status: Home / Self Care | Payer: Medicaid Other | Attending: Family Medicine | Admitting: Family Medicine

## 2022-10-06 ENCOUNTER — Ambulatory Visit (INDEPENDENT_AMBULATORY_CARE_PROVIDER_SITE_OTHER): Payer: BLUE CROSS/BLUE SHIELD

## 2022-10-06 DIAGNOSIS — N760 Acute vaginitis: Secondary | ICD-10-CM

## 2022-10-06 DIAGNOSIS — M79605 Pain in left leg: Secondary | ICD-10-CM

## 2022-10-06 DIAGNOSIS — B9689 Other specified bacterial agents as the cause of diseases classified elsewhere: Secondary | ICD-10-CM

## 2022-10-06 DIAGNOSIS — N898 Other specified noninflammatory disorders of vagina: Secondary | ICD-10-CM

## 2022-10-06 DIAGNOSIS — M79671 Pain in right foot: Secondary | ICD-10-CM

## 2022-10-06 MED ORDER — METRONIDAZOLE 500 MG PO TABS: 500.0000 mg | ORAL_TABLET | Freq: Two times a day (BID) | ORAL | 0 refills | Status: DC

## 2022-10-06 NOTE — Discharge Instructions (Signed)
You were seen today for various issues.  Your vaginal swab will be resulted tomorrow.  I have treated you with flagyl for now.  If anything else is positive you will be notified for treatment.  Your xrays were all normal.  You pain is likely soft tissue related. This should resolve on its own in time.  I recommend tylenol/motrin for pain, as well as a heating pad.

## 2022-10-06 NOTE — ED Triage Notes (Signed)
Pt reports falling through a vent in the floor 2 weeks ago. C/o pain in LLE - from inner thigh down to medial L ankle. Also c/o pain to dorsal side of R foot.  Pt also reports abnormal vaginal discharge x5 days with odor. Hx of BV - states feels similar.

## 2022-10-06 NOTE — ED Provider Notes (Addendum)
[facility]    CSN: [id number] Arrival date & time: 10/06/22  1148      History   Chief Complaint Chief Complaint  Patient presents with   Leg Pain   Vaginal Discharge    HPI Dorothy Wallace is a 22 y.o. female.   She is here for several issues.  Has vaginal d/c  5 days.  She has h/o BV and this seems similar.  Off white d/c.  No itching.  No known std exposures.   She is also here for leg pain. She was walking and fell through a vent in the floor.  She had pain initially, and has continued the last 2 weeks.  At the left medial thigh to the left mid calf.  Also the right foot;  both feet fell through vent.  Patient is requesting xrays be done today.           Past Medical History:  Diagnosis Date   Environmental allergies    History of chlamydia    History of gonorrhea    History of miscarriage     Patient Active Problem List   Diagnosis Date Noted   Miscarriage 09/15/2021   BV (bacterial vaginosis) 09/13/2021   Vaginal discharge 07/08/2021   Nipple discharge 07/08/2021   Well woman exam 07/08/2021   COVID-19 11/10/2019   Pregnancy of unknown anatomic location 08/23/2019   Chlamydia infection 10/09/2017   Seasonal allergic rhinitis due to pollen 10/08/2017   Dysthymia 01/21/2016   Oppositional defiant disorder 09/15/2013    History reviewed. No pertinent surgical history.  OB History   No obstetric history on file.      Home Medications    Prior to Admission medications   Medication Sig Start Date End Date Taking? Authorizing Provider  fluconazole (DIFLUCAN) 150 MG tablet Take one tab PO once today. Repeat in 72 hours if symptoms persist 06/23/22   Crain, Whitney L, PA  ibuprofen (ADVIL) 600 MG tablet Take 600 mg by mouth 3 (three) times daily. 09/01/21   [provider]  JUNEL FE 1/20 1-20 MG-MCG tablet Take 1 tablet by mouth daily. 09/15/21   [provider]  metroNIDAZOLE (FLAGYL) 500 MG tablet Take 1 tablet (500 mg  total) by mouth 2 (two) times daily. 08/07/22   Merrilee Jansky, MD  Multiple Vitamin (MULTI-VITAMIN PO) Take 1 tablet by mouth daily.    [provider]  Multiple Vitamins-Minerals (KP ADULTS DAILY FORMULA) TABS Take by mouth. 01/10/18   [provider]    Family History Family History  Problem Relation Age of Onset   Stroke Mother    Multiple sclerosis Father     Social History Social History   Tobacco Use   Smoking status: Never   Smokeless tobacco: Never  Vaping Use   Vaping Use: Never used  Substance Use Topics   Alcohol use: Never   Drug use: Never     Allergies   Pollen extract   Review of Systems Review of Systems  Constitutional: Negative.   HENT: Negative.    Respiratory: Negative.    Cardiovascular: Negative.   Gastrointestinal: Negative.   Genitourinary:  Positive for vaginal discharge.  Musculoskeletal:  Positive for arthralgias and myalgias.     Physical Exam Triage Vital Signs ED Triage Vitals  Enc Vitals Group     BP 10/06/22 1233 131/84     Pulse Rate 10/06/22 1233 71     Resp 10/06/22 1233 18     Temp 10/06/22  1233 99 F (37.2 C)     Temp Source 10/06/22 1233 Oral     SpO2 10/06/22 1233 98 %     Weight --      Height --      Head Circumference --      Peak Flow --      Pain Score 10/06/22 1231 10     Pain Loc --      Pain Edu? --      Excl. in GC? --    No data found.  Updated Vital Signs BP 131/84 (BP Location: Right Arm)   Pulse 71   Temp 99 F (37.2 C) (Oral)   Resp 18   LMP 09/29/2022 (Exact Date)   SpO2 98%   Visual Acuity Right Eye Distance:   Left Eye Distance:   Bilateral Distance:    Right Eye Near:   Left Eye Near:    Bilateral Near:     Physical Exam Constitutional:      Appearance: Normal appearance.  Cardiovascular:     Rate and Rhythm: Normal rate.  Pulmonary:     Effort: Pulmonary effort is normal.  Musculoskeletal:     Comments: She has TTP to the right medial foot just under  the medial malleolus, and at the 1st-3rd metatarsals;  +TTP to the left leg from the medial thigh to the medial lower leg;  soft tissue pain;  no bone or joint TTP  Skin:    General: Skin is warm and dry.  Neurological:     General: No focal deficit present.     Mental Status: She is alert.  Psychiatric:        Mood and Affect: Mood normal.      UC Treatments / Results  Labs (all labs ordered are listed, but only abnormal results are displayed) Labs Reviewed  CERVICOVAGINAL ANCILLARY ONLY    EKG   Radiology DG Femur Min 2 Views Left  Result Date: 10/06/2022 CLINICAL DATA:  Left leg pain after fall 2 weeks ago. EXAM: LEFT FEMUR 2 VIEWS COMPARISON:  None Available. FINDINGS: There is no evidence of fracture or other focal bone lesions. Soft tissues are unremarkable. IMPRESSION: Negative. Electronically Signed   By: Lupita Raider M.D.   On: 10/06/2022 13:20   DG Knee Complete 4 Views Left  Result Date: 10/06/2022 CLINICAL DATA:  Left knee pain after fall. EXAM: LEFT KNEE - COMPLETE 4+ VIEW COMPARISON:  None Available. FINDINGS: No evidence of fracture, dislocation, or joint effusion. No evidence of arthropathy or other focal bone abnormality. Soft tissues are unremarkable. IMPRESSION: Negative. Electronically Signed   By: Lupita Raider M.D.   On: 10/06/2022 13:19   DG Foot Complete Right  Result Date: 10/06/2022 CLINICAL DATA:  Right foot pain after fall 2 weeks ago. EXAM: RIGHT FOOT COMPLETE - 3+ VIEW COMPARISON:  None Available. FINDINGS: There is no evidence of fracture or dislocation. There is no evidence of arthropathy or other focal bone abnormality. Soft tissues are unremarkable. IMPRESSION: Negative. Electronically Signed   By: Lupita Raider M.D.   On: 10/06/2022 13:17    Procedures Procedures (including critical care time)  Medications Ordered in UC Medications - No data to display  Initial Impression / Assessment and Plan / UC Course  I have reviewed the triage  vital signs and the nursing notes.  Pertinent labs & imaging results that were available during my care of the patient were reviewed by me and considered in my  medical decision making (see chart for details).   Final Clinical Impressions(s) / UC Diagnoses   Final diagnoses:  Vaginal discharge  Bacterial vaginosis  Right foot pain  Left leg pain     Discharge Instructions      You were seen today for various issues.  Your vaginal swab will be resulted tomorrow.  I have treated you with flagyl for now.  If anything else is positive you will be notified for treatment.  Your xrays were all normal.  You pain is likely soft tissue related. This should resolve on its own in time.  I recommend tylenol/motrin for pain, as well as a heating pad.     ED Prescriptions   None    PDMP not reviewed this encounter.   Jannifer Franklin, MD 10/06/22 1325    Jannifer Franklin, MD 10/06/22 1429

## 2022-10-09 LAB — CERVICOVAGINAL ANCILLARY ONLY
Bacterial Vaginitis (gardnerella): POSITIVE — AB
Candida Glabrata: NEGATIVE
Candida Vaginitis: NEGATIVE
Chlamydia: NEGATIVE
Comment: NEGATIVE
Comment: NEGATIVE
Comment: NEGATIVE
Comment: NEGATIVE
Comment: NEGATIVE
Comment: NORMAL
Neisseria Gonorrhea: NEGATIVE
Trichomonas: NEGATIVE

## 2022-10-11 ENCOUNTER — Telehealth (HOSPITAL_COMMUNITY): Payer: Self-pay | Admitting: Emergency Medicine

## 2022-10-11 NOTE — Telephone Encounter (Signed)
Opened in error

## 2022-10-19 ENCOUNTER — Ambulatory Visit (HOSPITAL_COMMUNITY): Payer: Self-pay

## 2022-11-18 ENCOUNTER — Ambulatory Visit (HOSPITAL_COMMUNITY)
Admission: EM | Admit: 2022-11-18 | Discharge: 2022-11-18 | Disposition: A | Discharge status: Home / Self Care | Payer: Medicaid Other | Attending: Orthopedic Surgery | Admitting: Orthopedic Surgery

## 2022-11-18 ENCOUNTER — Other Ambulatory Visit: Payer: Self-pay

## 2022-11-18 ENCOUNTER — Encounter (HOSPITAL_COMMUNITY): Payer: Self-pay | Admitting: Emergency Medicine

## 2022-11-18 DIAGNOSIS — A549 Gonococcal infection, unspecified: Secondary | ICD-10-CM

## 2022-11-18 MED ORDER — DOXYCYCLINE HYCLATE 100 MG PO CAPS: 100.0000 mg | ORAL_CAPSULE | Freq: Two times a day (BID) | ORAL | 0 refills | Status: AC

## 2022-11-18 MED ORDER — CEFTRIAXONE SODIUM 500 MG IJ SOLR
INTRAMUSCULAR | Status: AC
  Filled 2022-11-18: qty 500

## 2022-11-18 MED ORDER — CEFTRIAXONE SODIUM 1 G IJ SOLR
0.5000 g | Freq: Once | INTRAMUSCULAR | Status: AC
  Administered 2022-11-18: 0.5 g via INTRAMUSCULAR

## 2022-11-18 MED ORDER — LIDOCAINE HCL (PF) 1 % IJ SOLN
INTRAMUSCULAR | Status: AC
  Filled 2022-11-18: qty 2

## 2022-11-18 NOTE — ED Triage Notes (Signed)
Patient saw provider and told positive for gonorrhea.  Patient was initially seen at a [facility] provider office and tested for std on Thursday.  Patient states she is here today for treatment

## 2022-11-18 NOTE — ED Provider Notes (Signed)
[facility]    CSN: [id number] Arrival date & time: 11/18/22  1638      History   Chief Complaint Chief Complaint  Patient presents with   SEXUALLY TRANSMITTED DISEASE   tested at [facility]    HPI Dorothy Wallace is a 22 y.o. female presents to the urgent care for evaluation of positive gonorrhea test.  Patient was seen 2 days ago at [facility], was tested for gonorrhea chlamydia and found to have positive gonorrhea test.  They called her today to let her know that she needed to seek treatment.  She comes in today for treatment of gonorrhea.  She has had some vaginal discharge but no abdominal pain, fever chills or bodyaches.  HPI  Past Medical History:  Diagnosis Date   Environmental allergies    History of chlamydia    History of gonorrhea    History of miscarriage     Patient Active Problem List   Diagnosis Date Noted   Miscarriage 09/15/2021   BV (bacterial vaginosis) 09/13/2021   Vaginal discharge 07/08/2021   Nipple discharge 07/08/2021   Well woman exam 07/08/2021   COVID-19 11/10/2019   Pregnancy of unknown anatomic location 08/23/2019   Chlamydia infection 10/09/2017   Seasonal allergic rhinitis due to pollen 10/08/2017   Dysthymia 01/21/2016   Oppositional defiant disorder 09/15/2013    History reviewed. No pertinent surgical history.  OB History   No obstetric history on file.      Home Medications    Prior to Admission medications   Medication Sig Start Date End Date Taking? Authorizing Provider  doxycycline (VIBRAMYCIN) 100 MG capsule Take 1 capsule (100 mg total) by mouth 2 (two) times daily for 7 days. 11/18/22 11/25/22 Yes Evon Slack, PA-C  ibuprofen (ADVIL) 600 MG tablet Take 600 mg by mouth 3 (three) times daily. 09/01/21   [provider]  JUNEL FE 1/20 1-20 MG-MCG tablet Take 1 tablet by mouth daily. 09/15/21   [provider]  Multiple Vitamin (MULTI-VITAMIN PO) Take 1 tablet by mouth daily.    [provider]   Multiple Vitamins-Minerals (KP ADULTS DAILY FORMULA) TABS Take by mouth. 01/10/18   [provider]    Family History Family History  Problem Relation Age of Onset   Stroke Mother    Multiple sclerosis Father     Social History Social History   Tobacco Use   Smoking status: Never   Smokeless tobacco: Never  Vaping Use   Vaping Use: Never used  Substance Use Topics   Alcohol use: Never   Drug use: Never     Allergies   Pollen extract   Review of Systems Review of Systems   Physical Exam Triage Vital Signs ED Triage Vitals  Enc Vitals Group     BP 11/18/22 1730 115/66     Pulse Rate 11/18/22 1730 76     Resp 11/18/22 1730 18     Temp 11/18/22 1730 98.1 F (36.7 C)     Temp Source 11/18/22 1730 Oral     SpO2 11/18/22 1730 99 %     Weight --      Height --      Head Circumference --      Peak Flow --      Pain Score 11/18/22 1728 0     Pain Loc --      Pain Edu? --      Excl. in GC? --    No data found.  Updated  Vital Signs BP 115/66 (BP Location: Left Arm) Comment: large cuff  Pulse 76   Temp 98.1 F (36.7 C) (Oral)   Resp 18   LMP 10/29/2022   SpO2 99%   Visual Acuity Right Eye Distance:   Left Eye Distance:   Bilateral Distance:    Right Eye Near:   Left Eye Near:    Bilateral Near:     Physical Exam Constitutional:      Appearance: She is well-developed.  HENT:     Head: Normocephalic and atraumatic.  Eyes:     Conjunctiva/sclera: Conjunctivae normal.  Cardiovascular:     Rate and Rhythm: Normal rate.  Pulmonary:     Effort: Pulmonary effort is normal. No respiratory distress.  Abdominal:     General: There is no distension.     Tenderness: There is no abdominal tenderness. There is no guarding.  Musculoskeletal:        General: Normal range of motion.     Cervical back: Normal range of motion.  Skin:    General: Skin is warm.     Findings: No rash.  Neurological:     Mental Status: She is alert and oriented to  person, place, and time.  Psychiatric:        Behavior: Behavior normal.        Thought Content: Thought content normal.      UC Treatments / Results  Labs (all labs ordered are listed, but only abnormal results are displayed) Labs Reviewed - No data to display  EKG   Radiology No results found.  Procedures Procedures (including critical care time)  Medications Ordered in UC Medications  cefTRIAXone (ROCEPHIN) injection 0.5 g (has no administration in time range)    Initial Impression / Assessment and Plan / UC Course  I have reviewed the triage vital signs and the nursing notes.  Pertinent labs & imaging results that were available during my care of the patient were reviewed by me and considered in my medical decision making (see chart for details).     22 year old female with positive gonorrhea test couple days ago.  Patient called by PCP to seek treatment.  She is given 500 mg of ceftriaxone IM x 1 and a prescription for doxycycline x 7 days.  She understands signs symptoms return to the urgent care or ER for.  She will follow-up PCP as needed. Final Clinical Impressions(s) / UC Diagnoses   Final diagnoses:  Gonorrhea     Discharge Instructions      Please complete doxycycline prescription.  Avoid sexual activity until completion of antibiotics.  Follow-up with PCP or health department if symptoms persist   ED Prescriptions     Medication Sig Dispense Auth. Provider   doxycycline (VIBRAMYCIN) 100 MG capsule Take 1 capsule (100 mg total) by mouth 2 (two) times daily for 7 days. 14 capsule Evon Slack, PA-C      PDMP not reviewed this encounter.   Evon Slack, [place] 11/18/22 [phone]

## 2022-11-18 NOTE — Discharge Instructions (Signed)
Please complete doxycycline prescription.  Avoid sexual activity until completion of antibiotics.  Follow-up with PCP or health department if symptoms persist

## 2022-12-07 ENCOUNTER — Other Ambulatory Visit: Payer: Self-pay

## 2022-12-07 ENCOUNTER — Encounter (HOSPITAL_COMMUNITY): Payer: Self-pay | Admitting: Emergency Medicine

## 2022-12-07 ENCOUNTER — Ambulatory Visit (HOSPITAL_COMMUNITY)
Admission: EM | Admit: 2022-12-07 | Discharge: 2022-12-07 | Disposition: A | Discharge status: Home / Self Care | Payer: Medicaid Other | Attending: Physician Assistant | Admitting: Physician Assistant

## 2022-12-07 ENCOUNTER — Ambulatory Visit: Payer: Self-pay

## 2022-12-07 DIAGNOSIS — R1030 Lower abdominal pain, unspecified: Secondary | ICD-10-CM | POA: Diagnosis not present

## 2022-12-07 DIAGNOSIS — N898 Other specified noninflammatory disorders of vagina: Secondary | ICD-10-CM | POA: Insufficient documentation

## 2022-12-07 DIAGNOSIS — J029 Acute pharyngitis, unspecified: Secondary | ICD-10-CM | POA: Diagnosis not present

## 2022-12-07 LAB — POCT URINALYSIS DIP (MANUAL ENTRY)
Bilirubin, UA: NEGATIVE
Blood, UA: NEGATIVE
Glucose, UA: NEGATIVE mg/dL
Leukocytes, UA: NEGATIVE
Nitrite, UA: NEGATIVE
Protein Ur, POC: NEGATIVE mg/dL
Spec Grav, UA: 1.025 (ref 1.010–1.025)
Urobilinogen, UA: 0.2 E.U./dL
pH, UA: 7 (ref 5.0–8.0)

## 2022-12-07 LAB — POCT URINE PREGNANCY: Preg Test, Ur: NEGATIVE

## 2022-12-07 LAB — POCT MONO SCREEN (KUC): Mono, POC: NEGATIVE

## 2022-12-07 MED ORDER — FLUCONAZOLE 150 MG PO TABS: 150.0000 mg | ORAL_TABLET | Freq: Once | ORAL | 0 refills | Status: AC

## 2022-12-07 NOTE — Discharge Instructions (Addendum)
Your mono testing was negative.  I believe that your throat symptoms are more related to allergies.  I do recommend that you gargle with warm salt water and use fluticasone nasal spray as well as an over-the-counter antihistamine such as cetirizine or loratadine daily.  We will contact you if your testing is positive.  If you have any worsening or changing symptoms please return for reevaluation including swelling of your throat, shortness of breath, fever, nausea, vomiting.  Based on how your discharge looked I do think it is possible this is a yeast infection.  Take Diflucan today.  We we will contact you if we need to arrange any additional treatment based on your swab results.  If you have any worsening or changing symptoms including pelvic pain, abdominal pain, fever, nausea, vomiting, change in discharge you should be seen immediately.

## 2022-12-07 NOTE — ED Provider Notes (Signed)
[facility]    CSN: [id number] Arrival date & time: 12/07/22  1641      History   Chief Complaint Chief Complaint  Patient presents with   Vaginal Discharge    HPI Dorothy Wallace is a 22 y.o. female.   Patient presents today with several concerns.  Her primary concern today is ongoing vaginal discharge.  She reports that in May 2024 she went to her OB/GYN we are doing her regular exam they found that she was positive for gonorrhea.  She presented to our clinic for treatment on 11/18/2022 and was given 500 mg of Rocephin and started on doxycycline 100 mg twice daily for 7 days.  She reports that she has had ongoing itching at the place of injection but denies any associated rash, swelling of her throat, shortness of breath.  She reports that initially symptoms improved but she continues to have significant vaginal discharge prompting evaluation today.  She reports that this is copious and malodorous.  She did recently change her formulation of birth control and wonders if this could be contributing to symptoms.  Denies any changes to personal hygiene products including soaps or detergents.  Denies any recent medication changes or additional antibiotic use outside of what was prescribed to treat STI.  She denies any pelvic pain, abdominal pain, fever, nausea, vomiting.  Patient also reports a several week history of sore throat.  Initially she thought this was related to allergies because she also had some congestion but the congestion has since resolved and she continues to have significant sore throat.  Sore throat symptoms are rated 6 on a 0-10 pain scale.  She denies any difficulty swallowing, speaking, swelling of her throat, shortness of breath.  She denies any known sick contacts.  Reports she has had gonorrhea in the throat before with similar presentation and is interested in being swabbed today.    Past Medical History:  Diagnosis Date   Environmental allergies    History  of chlamydia    History of gonorrhea    History of miscarriage     Patient Active Problem List   Diagnosis Date Noted   Miscarriage 09/15/2021   BV (bacterial vaginosis) 09/13/2021   Vaginal discharge 07/08/2021   Nipple discharge 07/08/2021   Well woman exam 07/08/2021   COVID-19 11/10/2019   Pregnancy of unknown anatomic location 08/23/2019   Chlamydia infection 10/09/2017   Seasonal allergic rhinitis due to pollen 10/08/2017   Dysthymia 01/21/2016   Oppositional defiant disorder 09/15/2013    History reviewed. No pertinent surgical history.  OB History   No obstetric history on file.      Home Medications    Prior to Admission medications   Medication Sig Start Date End Date Taking? Authorizing Provider  fluconazole (DIFLUCAN) 150 MG tablet Take 1 tablet (150 mg total) by mouth once for 1 dose. 12/07/22 12/07/22 Yes Lannis Lichtenwalner K, PA-C  ibuprofen (ADVIL) 600 MG tablet Take 600 mg by mouth 3 (three) times daily. 09/01/21   [provider]  JUNEL FE 1/20 1-20 MG-MCG tablet Take 1 tablet by mouth daily. 09/15/21   [provider]  Multiple Vitamin (MULTI-VITAMIN PO) Take 1 tablet by mouth daily.    [provider]  Multiple Vitamins-Minerals (KP ADULTS DAILY FORMULA) TABS Take by mouth. 01/10/18   [provider]    Family History Family History  Problem Relation Age of Onset   Stroke Mother    Multiple sclerosis Father  Social History Social History   Tobacco Use   Smoking status: Never   Smokeless tobacco: Never  Vaping Use   Vaping Use: Never used  Substance Use Topics   Alcohol use: Yes   Drug use: Never     Allergies   Pollen extract   Review of Systems Review of Systems  Constitutional:  Positive for activity change. Negative for appetite change, fatigue and fever.  HENT:  Positive for sore throat. Negative for congestion, sinus pressure and sneezing.   Respiratory:  Negative for cough and shortness of  breath.   Cardiovascular:  Negative for chest pain.  Gastrointestinal:  Negative for abdominal pain, diarrhea, nausea and vomiting.  Genitourinary:  Positive for vaginal discharge. Negative for dysuria, frequency, pelvic pain, urgency, vaginal bleeding and vaginal pain.     Physical Exam Triage Vital Signs ED Triage Vitals  Enc Vitals Group     BP 12/07/22 1727 104/69     Pulse Rate 12/07/22 1727 84     Resp 12/07/22 1727 18     Temp 12/07/22 1727 98.3 F (36.8 C)     Temp Source 12/07/22 1727 Oral     SpO2 12/07/22 1727 100 %     Weight --      Height --      Head Circumference --      Peak Flow --      Pain Score 12/07/22 1724 6     Pain Loc --      Pain Edu? --      Excl. in GC? --    No data found.  Updated Vital Signs BP 104/69 (BP Location: Left Arm) Comment (BP Location): large cuff  Pulse 84   Temp 98.3 F (36.8 C) (Oral)   Resp 18   LMP 11/24/2022   SpO2 100%   Visual Acuity Right Eye Distance:   Left Eye Distance:   Bilateral Distance:    Right Eye Near:   Left Eye Near:    Bilateral Near:     Physical Exam Vitals reviewed. Exam conducted with a chaperone present.  Constitutional:      General: She is awake. She is not in acute distress.    Appearance: Normal appearance. She is well-developed. She is not ill-appearing.     Comments: Very pleasant female appears stated age in no acute distress sitting comfortably in exam room  HENT:     Head: Normocephalic and atraumatic.     Right Ear: Tympanic membrane, ear canal and external ear normal.     Left Ear: Tympanic membrane, ear canal and external ear normal.     Nose: Nose normal.     Mouth/Throat:     Pharynx: Uvula midline. Posterior oropharyngeal erythema present. No oropharyngeal exudate.     Tonsils: No tonsillar exudate or tonsillar abscesses. 0 on the right. 0 on the left.  Cardiovascular:     Rate and Rhythm: Normal rate and regular rhythm.     Heart sounds: Normal heart sounds, S1 normal  and S2 normal. No murmur heard. Pulmonary:     Effort: Pulmonary effort is normal.     Breath sounds: Normal breath sounds. No wheezing, rhonchi or rales.     Comments: Clear to auscultation bilaterally Abdominal:     General: Bowel sounds are normal.     Palpations: Abdomen is soft.     Tenderness: There is no abdominal tenderness. There is no right CVA tenderness, left CVA tenderness, guarding or rebound.  Genitourinary:  Labia:        Right: No rash or tenderness.        Left: No rash or tenderness.      Vagina: Vaginal discharge present.     Cervix: Normal.     Uterus: Normal.      Adnexa: Right adnexa normal and left adnexa normal.       Right: No mass or tenderness.         Left: No mass or tenderness.       Comments: Kim, RN present as chaperone during exam.  Thick white discharge noted posterior vaginal vault.  No pain with bimanual exam. Psychiatric:        Behavior: Behavior is cooperative.      UC Treatments / Results  Labs (all labs ordered are listed, but only abnormal results are displayed) Labs Reviewed  POCT URINALYSIS DIP (MANUAL ENTRY) - Abnormal; Notable for the following components:      Result Value   Ketones, POC UA trace (5) (*)    All other components within normal limits  POCT MONO SCREEN ([facility])  POCT URINE PREGNANCY  CERVICOVAGINAL ANCILLARY ONLY  CYTOLOGY, (ORAL, ANAL, URETHRAL) ANCILLARY ONLY    EKG   Radiology No results found.  Procedures Procedures (including critical care time)  Medications Ordered in UC Medications - No data to display  Initial Impression / Assessment and Plan / UC Course  I have reviewed the triage vital signs and the nursing notes.  Pertinent labs & imaging results that were available during my care of the patient were reviewed by me and considered in my medical decision making (see chart for details).     Patient is well-appearing, afebrile, nontoxic, nontachycardic.  Mono testing was obtained given 4  weeks of sore throat and was negative.  Low suspicion for strep pharyngitis given clinical presentation without exudate, tonsillar swelling, lymphadenopathy.  Suspect seasonal allergies are contributing to symptoms.  She was encouraged conservative treatment measures including over-the-counter antihistamine and fluticasone.  Also recommended that she use over-the-counter analgesics and gargle with warm salt water.  She has had gonorrhea in the throat before and so STI swab was collected and is pending.  Will defer treatment until results are available.  If she has any worsening symptoms she is to return for reevaluation.  Concern for yeast vaginitis given clinical presentation including look of discharge.  Suspect that this was triggered by her recent antibiotics to treat gonorrhea.  STI swab was collected today and results are pending.  No indication for treatment of PID based on exam.  Urine was obtained that was normal.  Urine pregnancy was negative.  She was given 1 dose of Diflucan and we will send off swab to check for any additional causes of symptoms.  If need to arrange additional treatment we will contact her.  She was encouraged to rest and drink plenty of fluid.  Discussed that she should follow-up with her OB/GYN if symptoms persist.  Discussed that if she has any worsening symptoms including pelvic pain, abdominal pain, fever, nausea, vomiting she should be seen immediately.  Strict return precautions given.  Final Clinical Impressions(s) / UC Diagnoses   Final diagnoses:  Pharyngitis, unspecified etiology  Sore throat  Vaginal discharge  Lower abdominal pain     Discharge Instructions      Your mono testing was negative.  I believe that your throat symptoms are more related to allergies.  I do recommend that you gargle with warm salt water  and use fluticasone nasal spray as well as an over-the-counter antihistamine such as cetirizine or loratadine daily.  We will contact you if your  testing is positive.  If you have any worsening or changing symptoms please return for reevaluation including swelling of your throat, shortness of breath, fever, nausea, vomiting.  Based on how your discharge looked I do think it is possible this is a yeast infection.  Take Diflucan today.  We we will contact you if we need to arrange any additional treatment based on your swab results.  If you have any worsening or changing symptoms including pelvic pain, abdominal pain, fever, nausea, vomiting, change in discharge you should be seen immediately.     ED Prescriptions     Medication Sig Dispense Auth. Provider   fluconazole (DIFLUCAN) 150 MG tablet Take 1 tablet (150 mg total) by mouth once for 1 dose. 1 tablet Krissa Utke, Noberto Retort, PA-C      PDMP not reviewed this encounter.   Jeani Hawking, PA-C 12/07/22 1845

## 2022-12-07 NOTE — ED Triage Notes (Addendum)
Vaginal discharge has worsened.  And throat irritation has worsened. patient was seen 6/1.  Patient was treated for gonorrhea.  Patient does not think treatment worked.  Reports her provider instructed her to return for further testing.  Patient also complains where she received her injection has knot and is sore.  On further exam and palpation, where patient is pointing is not where shot is given

## 2022-12-08 ENCOUNTER — Telehealth (HOSPITAL_COMMUNITY): Payer: Self-pay | Admitting: Emergency Medicine

## 2022-12-08 LAB — CYTOLOGY, (ORAL, ANAL, URETHRAL) ANCILLARY ONLY
Chlamydia: NEGATIVE
Comment: NEGATIVE
Comment: NORMAL
Neisseria Gonorrhea: NEGATIVE

## 2022-12-08 LAB — CERVICOVAGINAL ANCILLARY ONLY
Bacterial Vaginitis (gardnerella): POSITIVE — AB
Candida Glabrata: NEGATIVE
Candida Vaginitis: NEGATIVE
Chlamydia: NEGATIVE
Comment: NEGATIVE
Comment: NEGATIVE
Comment: NEGATIVE
Comment: NEGATIVE
Comment: NEGATIVE
Comment: NORMAL
Neisseria Gonorrhea: NEGATIVE
Trichomonas: NEGATIVE

## 2022-12-08 MED ORDER — METRONIDAZOLE 500 MG PO TABS: 500.0000 mg | ORAL_TABLET | Freq: Two times a day (BID) | ORAL | 0 refills | Status: DC

## 2022-12-15 ENCOUNTER — Encounter (HOSPITAL_COMMUNITY): Payer: Self-pay

## 2022-12-15 ENCOUNTER — Ambulatory Visit (HOSPITAL_COMMUNITY)
Admission: EM | Admit: 2022-12-15 | Discharge: 2022-12-15 | Disposition: A | Discharge status: Home / Self Care | Payer: Medicaid Other | Attending: Family Medicine | Admitting: Family Medicine

## 2022-12-15 DIAGNOSIS — Z202 Contact with and (suspected) exposure to infections with a predominantly sexual mode of transmission: Secondary | ICD-10-CM | POA: Diagnosis not present

## 2022-12-15 MED ORDER — CEFTRIAXONE SODIUM 500 MG IJ SOLR
INTRAMUSCULAR | Status: AC
  Filled 2022-12-15: qty 500

## 2022-12-15 MED ORDER — CEFTRIAXONE SODIUM 500 MG IJ SOLR
500.0000 mg | Freq: Once | INTRAMUSCULAR | Status: AC
  Administered 2022-12-15: 500 mg via INTRAMUSCULAR

## 2022-12-15 MED ORDER — LIDOCAINE HCL (PF) 1 % IJ SOLN
INTRAMUSCULAR | Status: AC
  Filled 2022-12-15: qty 2

## 2022-12-15 NOTE — ED Provider Notes (Signed)
[facility]    CSN: [id number] Arrival date & time: 12/15/22  1257      History   Chief Complaint Chief Complaint  Patient presents with   wants STD testing    HPI Dorothy Wallace is a 22 y.o. female.   HPI Patient here for STD exposure treatment.  Patient was seen here on 12/07/2022 tested positive for BV and prescribed metronidazole.  Patient reports on 12/10/2022 having sex with a partner who has recently tested positive for gonorrhea.  Patient is requesting treatment which covers for exposure to gonorrhea.  Past Medical History:  Diagnosis Date   Environmental allergies    History of chlamydia    History of gonorrhea    History of miscarriage     Patient Active Problem List   Diagnosis Date Noted   Miscarriage 09/15/2021   BV (bacterial vaginosis) 09/13/2021   Vaginal discharge 07/08/2021   Nipple discharge 07/08/2021   Well woman exam 07/08/2021   COVID-19 11/10/2019   Pregnancy of unknown anatomic location 08/23/2019   Chlamydia infection 10/09/2017   Seasonal allergic rhinitis due to pollen 10/08/2017   Dysthymia 01/21/2016   Oppositional defiant disorder 09/15/2013    History reviewed. No pertinent surgical history.  OB History   No obstetric history on file.      Home Medications    Prior to Admission medications   Medication Sig Start Date End Date Taking? Authorizing Provider  ibuprofen (ADVIL) 600 MG tablet Take 600 mg by mouth 3 (three) times daily. 09/01/21   [provider]  JUNEL FE 1/20 1-20 MG-MCG tablet Take 1 tablet by mouth daily. 09/15/21   [provider]  metroNIDAZOLE (FLAGYL) 500 MG tablet Take 1 tablet (500 mg total) by mouth 2 (two) times daily. 12/08/22   Merrilee Jansky, MD  Multiple Vitamin (MULTI-VITAMIN PO) Take 1 tablet by mouth daily.    [provider]  Multiple Vitamins-Minerals (KP ADULTS DAILY FORMULA) TABS Take by mouth. 01/10/18   [provider]    Family History Family  History  Problem Relation Age of Onset   Stroke Mother    Multiple sclerosis Father     Social History Social History   Tobacco Use   Smoking status: Never   Smokeless tobacco: Never  Vaping Use   Vaping Use: Never used  Substance Use Topics   Alcohol use: Yes   Drug use: Never     Allergies   Pollen extract   Review of Systems Review of Systems Pertinent negatives listed in HPI  Physical Exam Triage Vital Signs ED Triage Vitals  Enc Vitals Group     BP 12/15/22 1333 115/88     Pulse Rate 12/15/22 1333 69     Resp 12/15/22 1333 14     Temp 12/15/22 1333 98.1 F (36.7 C)     Temp Source 12/15/22 1333 Oral     SpO2 12/15/22 1333 99 %     Weight --      Height --      Head Circumference --      Peak Flow --      Pain Score 12/15/22 1335 0     Pain Loc --      Pain Edu? --      Excl. in GC? --    No data found.  Updated Vital Signs BP 115/88 (BP Location: Right Arm)   Pulse 69   Temp 98.1 F (36.7 C) (Oral)   Resp 14  LMP 11/24/2022   SpO2 99%   Visual Acuity Right Eye Distance:   Left Eye Distance:   Bilateral Distance:    Right Eye Near:   Left Eye Near:    Bilateral Near:     Physical Exam General appearance: Alert, well developed, well nourished, cooperative and in no distress Head: Normocephalic, without obvious abnormality, atraumatic Respiratory: Respirations even and unlabored, normal respiratory rate Heart: Rate and rhythm normal. No gallop or murmurs noted on exam  Extremities: No gross deformities Skin: Skin color, texture, turgor normal. No rashes seen  UC Treatments / Results  Labs (all labs ordered are listed, but only abnormal results are displayed) Labs Reviewed - No data to display  EKG   Radiology No results found.  Procedures Procedures (including critical care time)  Medications Ordered in UC Medications  cefTRIAXone (ROCEPHIN) injection 500 mg (500 mg Intramuscular Given 12/15/22 1355)    Initial Impression  / Assessment and Plan / UC Course  I have reviewed the triage vital signs and the nursing notes.  Pertinent labs & imaging results that were available during my care of the patient were reviewed by me and considered in my medical decision making (see chart for details).    Recent exposure to partner who tested positive for gonorrhea.  Rocephin 500 mg given IM here in clinic. Educated on safe sex practices.  Return precautions given. Final Clinical Impressions(s) / UC Diagnoses   Final diagnoses:  Exposure to sexually transmitted disease (STD)   Discharge Instructions   None    ED Prescriptions   None    PDMP not reviewed this encounter.   Bing Neighbors, NP 12/15/22 [phone]

## 2022-12-15 NOTE — ED Triage Notes (Signed)
Patient states she was seen on 12/07/22. Patient was told that she had a RX and states she picked that up.  Patient states she had sex on the 12/10/22 and the person she had sex with tested positive for gonorrhea. Patient is wanting treatment for gonorrhea.

## 2022-12-27 ENCOUNTER — Ambulatory Visit (HOSPITAL_COMMUNITY)
Admission: RE | Admit: 2022-12-27 | Discharge: 2022-12-27 | Disposition: A | Discharge status: Home / Self Care | Payer: BLUE CROSS/BLUE SHIELD | Source: Ambulatory Visit | Attending: Nurse Practitioner | Admitting: Nurse Practitioner

## 2022-12-27 ENCOUNTER — Encounter (HOSPITAL_COMMUNITY): Payer: Self-pay

## 2022-12-27 VITALS — BP 117/64 | HR 74 | Temp 98.7°F | Resp 18

## 2022-12-27 DIAGNOSIS — N898 Other specified noninflammatory disorders of vagina: Secondary | ICD-10-CM | POA: Diagnosis not present

## 2022-12-27 DIAGNOSIS — Z3202 Encounter for pregnancy test, result negative: Secondary | ICD-10-CM | POA: Diagnosis not present

## 2022-12-27 LAB — POCT URINALYSIS DIP (MANUAL ENTRY)
Bilirubin, UA: NEGATIVE
Blood, UA: NEGATIVE
Glucose, UA: NEGATIVE mg/dL
Ketones, POC UA: NEGATIVE mg/dL
Leukocytes, UA: NEGATIVE
Nitrite, UA: NEGATIVE
Protein Ur, POC: NEGATIVE mg/dL
Spec Grav, UA: >=1.03 — AB (ref 1.010–1.025)
Urobilinogen, UA: 0.2 E.U./dL
pH, UA: 5.5 (ref 5.0–8.0)

## 2022-12-27 LAB — POCT URINE PREGNANCY: Preg Test, Ur: NEGATIVE

## 2022-12-27 LAB — HIV ANTIBODY (ROUTINE TESTING W REFLEX): HIV Screen 4th Generation wRfx: NONREACTIVE

## 2022-12-27 NOTE — ED Triage Notes (Signed)
Pt presents to office for STD- recheck. Pt reports vaginal discharge with odor.

## 2022-12-27 NOTE — ED Provider Notes (Signed)
[facility]    CSN: [id number] Arrival date & time: 12/27/22  1154      History   Chief Complaint Chief Complaint  Patient presents with   SEXUALLY TRANSMITTED DISEASE    Follow up std / sti testing slight irratation - Entered by patient    HPI Dorothy Wallace is a 22 y.o. female.   Patient presents today with a few weeks of thin, milky/gray vaginal discharge with malodorous smell.  Reports history of frequent BV.  Reports was recently treated for gonorrhea and wants to make sure this is fully gone.  Denies sexual activity since that she was diagnosed with gonorrhea.  No fever, nausea/vomiting, abdominal pain, pelvic pain, back pain.  Also denies dysuria, however she is having a little bit of urinary frequency and urgency.  No voiding, no urinary odor.  No hematuria.  No known exposures to STI she has been abstinent for the past month or so.    Past Medical History:  Diagnosis Date   Environmental allergies    History of chlamydia    History of gonorrhea    History of miscarriage     Patient Active Problem List   Diagnosis Date Noted   Miscarriage 09/15/2021   BV (bacterial vaginosis) 09/13/2021   Vaginal discharge 07/08/2021   Nipple discharge 07/08/2021   Well woman exam 07/08/2021   COVID-19 11/10/2019   Pregnancy of unknown anatomic location 08/23/2019   Chlamydia infection 10/09/2017   Seasonal allergic rhinitis due to pollen 10/08/2017   Dysthymia 01/21/2016   Oppositional defiant disorder 09/15/2013    History reviewed. No pertinent surgical history.  OB History   No obstetric history on file.      Home Medications    Prior to Admission medications   Medication Sig Start Date End Date Taking? Authorizing Provider  ibuprofen (ADVIL) 600 MG tablet Take 600 mg by mouth 3 (three) times daily. 09/01/21   [provider]  JUNEL FE 1/20 1-20 MG-MCG tablet Take 1 tablet by mouth daily. 09/15/21   [provider]  metroNIDAZOLE  (FLAGYL) 500 MG tablet Take 1 tablet (500 mg total) by mouth 2 (two) times daily. 12/08/22   Merrilee Jansky, MD  Multiple Vitamin (MULTI-VITAMIN PO) Take 1 tablet by mouth daily.    [provider]  Multiple Vitamins-Minerals (KP ADULTS DAILY FORMULA) TABS Take by mouth. 01/10/18   [provider]    Family History Family History  Problem Relation Age of Onset   Stroke Mother    Multiple sclerosis Father     Social History Social History   Tobacco Use   Smoking status: Never   Smokeless tobacco: Never  Vaping Use   Vaping Use: Never used  Substance Use Topics   Alcohol use: Yes   Drug use: Never     Allergies   Gramineae pollens and Pollen extract   Review of Systems Review of Systems Per HPI  Physical Exam Triage Vital Signs ED Triage Vitals [12/27/22 1200]  Enc Vitals Group     BP 117/64     Pulse Rate 74     Resp 18     Temp 98.7 F (37.1 C)     Temp Source Oral     SpO2 97 %     Weight      Height      Head Circumference      Peak Flow      Pain Score      Pain Loc  Pain Edu?      Excl. in GC?    No data found.  Updated Vital Signs BP 117/64 (BP Location: Left Arm)   Pulse 74   Temp 98.7 F (37.1 C) (Oral)   Resp 18   LMP 12/12/2022   SpO2 97%   Visual Acuity Right Eye Distance:   Left Eye Distance:   Bilateral Distance:    Right Eye Near:   Left Eye Near:    Bilateral Near:     Physical Exam Vitals and nursing note reviewed.  Constitutional:      General: She is not in acute distress.    Appearance: Normal appearance. She is not toxic-appearing.  Pulmonary:     Effort: Pulmonary effort is normal. No respiratory distress.  Genitourinary:    Comments: Deferred - self swab performed by patient Skin:    General: Skin is warm and dry.     Coloration: Skin is not jaundiced or pale.     Findings: No erythema.  Neurological:     Mental Status: She is alert and oriented to person, place, and time.     Motor:  No weakness.     Gait: Gait normal.  Psychiatric:        Behavior: Behavior is cooperative.      UC Treatments / Results  Labs (all labs ordered are listed, but only abnormal results are displayed) Labs Reviewed  POCT URINALYSIS DIP (MANUAL ENTRY) - Abnormal; Notable for the following components:      Result Value   Spec Grav, UA >=1.030 (*)    All other components within normal limits  HIV ANTIBODY (ROUTINE TESTING W REFLEX)  RPR  POCT URINE PREGNANCY  CERVICOVAGINAL ANCILLARY ONLY    EKG   Radiology No results found.  Procedures Procedures (including critical care time)  Medications Ordered in UC Medications - No data to display  Initial Impression / Assessment and Plan / UC Course  I have reviewed the triage vital signs and the nursing notes.  Pertinent labs & imaging results that were available during my care of the patient were reviewed by me and considered in my medical decision making (see chart for details).   Patient is well-appearing, normotensive, afebrile, not tachycardic, not tachypneic, oxygenating well on room air.    1. Vaginal discharge Cytology is also pending HIV and syphilis test also pending per patient request Treat as indicated UPT negative Urinalysis unremarkable, urine culture deferred  The patient was given the opportunity to ask questions.  All questions answered to their satisfaction.  The patient is in agreement to this plan.    Final Clinical Impressions(s) / UC Diagnoses   Final diagnoses:  Vaginal discharge  Negative pregnancy test     Discharge Instructions      Urine test today shows that you need to drink more water, no signs of infection  We will contact you if the testing from today shows anything that requires treatment  Recommend condom use with every sexual encounter     ED Prescriptions   None    PDMP not reviewed this encounter.   Valentino Nose, NP 12/27/22 [phone]

## 2022-12-27 NOTE — Discharge Instructions (Addendum)
Urine test today shows that you need to drink more water, no signs of infection  We will contact you if the testing from today shows anything that requires treatment  Recommend condom use with every sexual encounter

## 2022-12-28 ENCOUNTER — Telehealth (HOSPITAL_COMMUNITY): Payer: Self-pay | Admitting: Emergency Medicine

## 2022-12-28 LAB — CERVICOVAGINAL ANCILLARY ONLY
Bacterial Vaginitis (gardnerella): POSITIVE — AB
Candida Glabrata: NEGATIVE
Candida Vaginitis: NEGATIVE
Chlamydia: NEGATIVE
Comment: NEGATIVE
Comment: NEGATIVE
Comment: NEGATIVE
Comment: NEGATIVE
Comment: NEGATIVE
Comment: NORMAL
Neisseria Gonorrhea: NEGATIVE
Trichomonas: NEGATIVE

## 2022-12-28 LAB — RPR: RPR Ser Ql: NONREACTIVE

## 2022-12-28 MED ORDER — METRONIDAZOLE 500 MG PO TABS
500.0000 mg | ORAL_TABLET | Freq: Two times a day (BID) | ORAL | 0 refills | Status: DC
Start: 2022-12-28 — End: 2023-02-09

## 2023-02-09 ENCOUNTER — Ambulatory Visit
Admission: EM | Admit: 2023-02-09 | Discharge: 2023-02-09 | Disposition: A | Discharge status: Home / Self Care | Payer: BLUE CROSS/BLUE SHIELD | Source: Home / Self Care

## 2023-02-09 DIAGNOSIS — Z113 Encounter for screening for infections with a predominantly sexual mode of transmission: Secondary | ICD-10-CM | POA: Diagnosis present

## 2023-02-09 DIAGNOSIS — N898 Other specified noninflammatory disorders of vagina: Secondary | ICD-10-CM

## 2023-02-09 DIAGNOSIS — J029 Acute pharyngitis, unspecified: Secondary | ICD-10-CM | POA: Diagnosis present

## 2023-02-09 LAB — POCT URINALYSIS DIP (MANUAL ENTRY)
Bilirubin, UA: NEGATIVE
Blood, UA: NEGATIVE
Glucose, UA: NEGATIVE mg/dL
Ketones, POC UA: NEGATIVE mg/dL
Leukocytes, UA: NEGATIVE
Nitrite, UA: NEGATIVE
Protein Ur, POC: 30 mg/dL — AB
Spec Grav, UA: 1.025 (ref 1.010–1.025)
Urobilinogen, UA: 0.2 E.U./dL
pH, UA: 7.5 (ref 5.0–8.0)

## 2023-02-09 LAB — POCT URINE PREGNANCY: Preg Test, Ur: NEGATIVE

## 2023-02-09 LAB — POCT RAPID STREP A (OFFICE): Rapid Strep A Screen: NEGATIVE

## 2023-02-09 MED ORDER — METRONIDAZOLE 500 MG PO TABS
500.0000 mg | ORAL_TABLET | Freq: Two times a day (BID) | ORAL | 0 refills | Status: DC
Start: 2023-02-09 — End: 2023-03-06

## 2023-02-09 NOTE — ED Provider Notes (Signed)
[facility]    CSN: [id number] Arrival date & time: 02/09/23  1431      History   Chief Complaint No chief complaint on file.   HPI Dorothy Wallace is a 22 y.o. female  presents for evaluation of URI symptoms for 4 days. Patient reports associated symptoms of sore throat and runny nose. Denies N/V/D, cough, congestion, fevers, ear pain, body aches, shortness of breath.  In addition she reports 1 week of vaginal discharge.  Reports a history of reoccurring BV infections and states this is similar.  No dysuria.  She also states she was treated for gonorrhea in June with a negative follow-up test in July.  No known STD exposure but she would like additional screening while here.  Pt has taken throat lozenges OTC for symptoms. Pt has no other concerns at this time.   HPI  Past Medical History:  Diagnosis Date   Environmental allergies    History of chlamydia    History of gonorrhea    History of miscarriage     Patient Active Problem List   Diagnosis Date Noted   Miscarriage 09/15/2021   BV (bacterial vaginosis) 09/13/2021   Vaginal discharge 07/08/2021   Nipple discharge 07/08/2021   Well woman exam 07/08/2021   COVID-19 11/10/2019   Pregnancy of unknown anatomic location 08/23/2019   Chlamydia infection 10/09/2017   Seasonal allergic rhinitis due to pollen 10/08/2017   Dysthymia 01/21/2016   Oppositional defiant disorder 09/15/2013    History reviewed. No pertinent surgical history.  OB History   No obstetric history on file.      Home Medications    Prior to Admission medications   Medication Sig Start Date End Date Taking? Authorizing Provider  metroNIDAZOLE (FLAGYL) 500 MG tablet Take 1 tablet (500 mg total) by mouth 2 (two) times daily. 02/09/23  Yes Radford Pax, NP  ibuprofen (ADVIL) 600 MG tablet Take 600 mg by mouth 3 (three) times daily. 09/01/21   [provider]  JUNEL FE 1/20 1-20 MG-MCG tablet Take 1 tablet by mouth daily. 09/15/21    [provider]  Multiple Vitamin (MULTI-VITAMIN PO) Take 1 tablet by mouth daily.    [provider]  Multiple Vitamins-Minerals (KP ADULTS DAILY FORMULA) TABS Take by mouth. 01/10/18   [provider]    Family History Family History  Problem Relation Age of Onset   Stroke Mother    Multiple sclerosis Father     Social History Social History   Tobacco Use   Smoking status: Never   Smokeless tobacco: Never  Vaping Use   Vaping status: Never Used  Substance Use Topics   Alcohol use: Yes   Drug use: Never     Allergies   Gramineae pollens and Pollen extract   Review of Systems Review of Systems  HENT:  Positive for sore throat.   Genitourinary:  Positive for vaginal discharge.     Physical Exam Triage Vital Signs ED Triage Vitals  Encounter Vitals Group     BP 02/09/23 1553 123/81     Systolic BP Percentile --      Diastolic BP Percentile --      Pulse Rate 02/09/23 1553 71     Resp 02/09/23 1553 16     Temp 02/09/23 1553 98.2 F (36.8 C)     Temp Source 02/09/23 1553 Oral     SpO2 02/09/23 1553 100 %     Weight --      Height --  Head Circumference --      Peak Flow --      Pain Score 02/09/23 1557 0     Pain Loc --      Pain Education --      Exclude from Growth Chart --    No data found.  Updated Vital Signs BP 123/81 (BP Location: Left Arm)   Pulse 71   Temp 98.2 F (36.8 C) (Oral)   Resp 16   LMP 01/21/2023 (Exact Date)   SpO2 100%   Visual Acuity Right Eye Distance:   Left Eye Distance:   Bilateral Distance:    Right Eye Near:   Left Eye Near:    Bilateral Near:     Physical Exam Vitals and nursing note reviewed.  Constitutional:      General: She is not in acute distress.    Appearance: She is well-developed. She is not ill-appearing.  HENT:     Head: Normocephalic and atraumatic.     Right Ear: Tympanic membrane and ear canal normal.     Left Ear: Tympanic membrane and ear canal normal.      Nose: No congestion.     Mouth/Throat:     Mouth: Mucous membranes are moist.     Pharynx: Oropharynx is clear. Uvula midline. Posterior oropharyngeal erythema present.     Tonsils: No tonsillar exudate or tonsillar abscesses.  Eyes:     Conjunctiva/sclera: Conjunctivae normal.     Pupils: Pupils are equal, round, and reactive to light.  Cardiovascular:     Rate and Rhythm: Normal rate and regular rhythm.     Heart sounds: Normal heart sounds.  Pulmonary:     Effort: Pulmonary effort is normal.     Breath sounds: Normal breath sounds.  Musculoskeletal:     Cervical back: Normal range of motion and neck supple.  Lymphadenopathy:     Cervical: No cervical adenopathy.  Skin:    General: Skin is warm and dry.  Neurological:     General: No focal deficit present.     Mental Status: She is alert and oriented to person, place, and time.  Psychiatric:        Mood and Affect: Mood normal.        Behavior: Behavior normal.      UC Treatments / Results  Labs (all labs ordered are listed, but only abnormal results are displayed) Labs Reviewed  POCT URINALYSIS DIP (MANUAL ENTRY) - Abnormal; Notable for the following components:      Result Value   Clarity, UA cloudy (*)    Protein Ur, POC =30 (*)    All other components within normal limits  CULTURE, GROUP A STREP [facility])  POCT RAPID STREP A (OFFICE)  POCT URINE PREGNANCY  CERVICOVAGINAL ANCILLARY ONLY    EKG   Radiology No results found.  Procedures Procedures (including critical care time)  Medications Ordered in UC Medications - No data to display  Initial Impression / Assessment and Plan / UC Course  I have reviewed the triage vital signs and the nursing notes.  Pertinent labs & imaging results that were available during my care of the patient were reviewed by me and considered in my medical decision making (see chart for details).     Reviewed exam and symptoms with patient.  No red flags.  UA and hCG negative.   Negative rapid strep, will culture.  Discussed viral illness and symptomatic treatment.  Vaginal swab is ordered and will contact for any positive  results.  Will treat for BV given symptoms and history.  Lots of rest and fluids.  PCP follow-up if symptoms do not improve.  ER precautions reviewed and patient verbalized understanding. Final Clinical Impressions(s) / UC Diagnoses   Final diagnoses:  Vaginal discharge  Screening examination for STD (sexually transmitted disease)  Sore throat  Acute pharyngitis, unspecified etiology     Discharge Instructions      The clinic will contact you with results of the strep throat culture and vaginal swab done today if positive.  Start Flagyl twice daily for 7 days to treat your vaginal discharge.  Please follow-up with your PCP or GYN if your symptoms do not improve.  Please go to the ER for any worsening symptoms.  I hope you feel better soon!    ED Prescriptions     Medication Sig Dispense Auth. Provider   metroNIDAZOLE (FLAGYL) 500 MG tablet Take 1 tablet (500 mg total) by mouth 2 (two) times daily. 14 tablet Radford Pax, NP      PDMP not reviewed this encounter.   Radford Pax, NP 02/09/23 [phone]

## 2023-02-09 NOTE — ED Triage Notes (Signed)
Pt presents to UC w/ c/o vaginal discharge x1 week. Hx bv Pt reports sore throat and painful swallowing x4 days Lozenges did not help.

## 2023-02-09 NOTE — Discharge Instructions (Addendum)
The clinic will contact you with results of the strep throat culture and vaginal swab done today if positive.  Start Flagyl twice daily for 7 days to treat your vaginal discharge.  Please follow-up with your PCP or GYN if your symptoms do not improve.  Please go to the ER for any worsening symptoms.  I hope you feel better soon!

## 2023-02-12 ENCOUNTER — Telehealth (HOSPITAL_COMMUNITY): Payer: Self-pay | Admitting: Emergency Medicine

## 2023-02-12 LAB — CERVICOVAGINAL ANCILLARY ONLY
Bacterial Vaginitis (gardnerella): POSITIVE — AB
Candida Glabrata: NEGATIVE
Candida Vaginitis: POSITIVE — AB
Chlamydia: NEGATIVE
Comment: NEGATIVE
Comment: NEGATIVE
Comment: NEGATIVE
Comment: NEGATIVE
Comment: NEGATIVE
Comment: NORMAL
Neisseria Gonorrhea: NEGATIVE
Trichomonas: NEGATIVE

## 2023-02-12 LAB — CULTURE, GROUP A STREP (THRC)

## 2023-02-12 IMAGING — US US OB < 14 WEEKS - US OB TV
1 series · 13 of 28 positions shown · non-contrast
Comparison: None

CLINICAL DATA: Cramping in first trimester of pregnancy, LMP
06/23/2021

EXAM:
OBSTETRIC <14 WK US AND TRANSVAGINAL OB US
TECHNIQUE: Both transabdominal and transvaginal ultrasound examinations were
performed for complete evaluation of the gestation as well as the
maternal uterus, adnexal regions, and pelvic cul-de-sac.
Transvaginal technique was performed to assess early pregnancy.

[Series 1: us ob comp less 14 wks · 95 acquisitions, 13 frames shown]
[im 4/95]
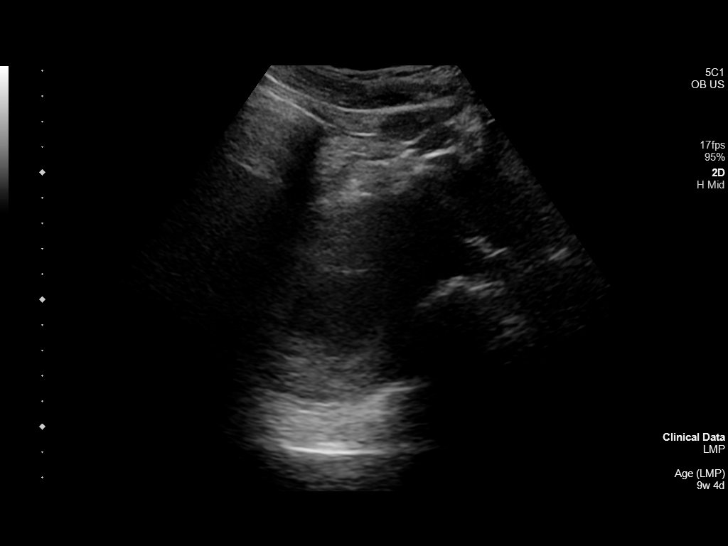
[im 11/95]
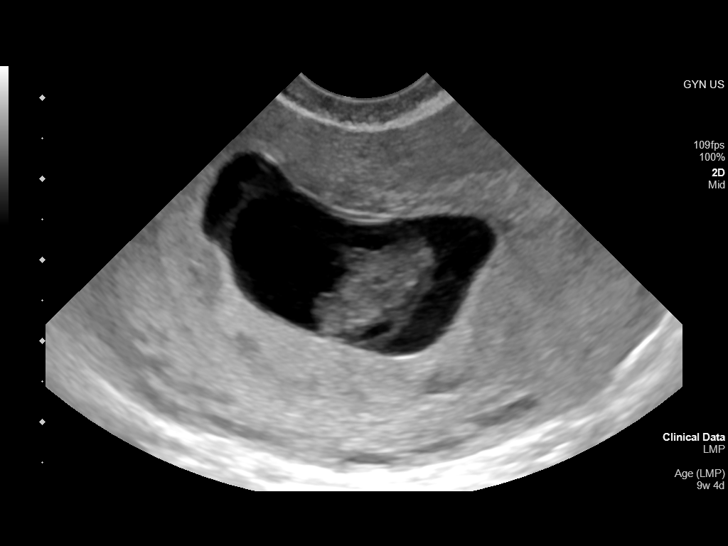
[im 18/95]
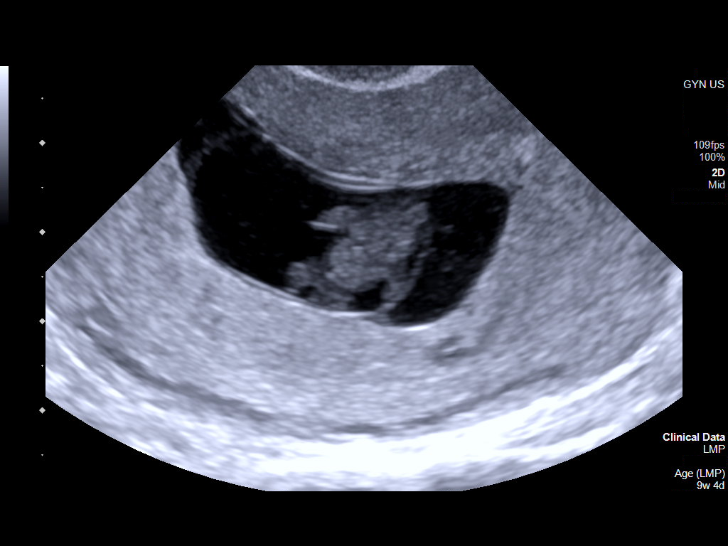
[im 25/95]
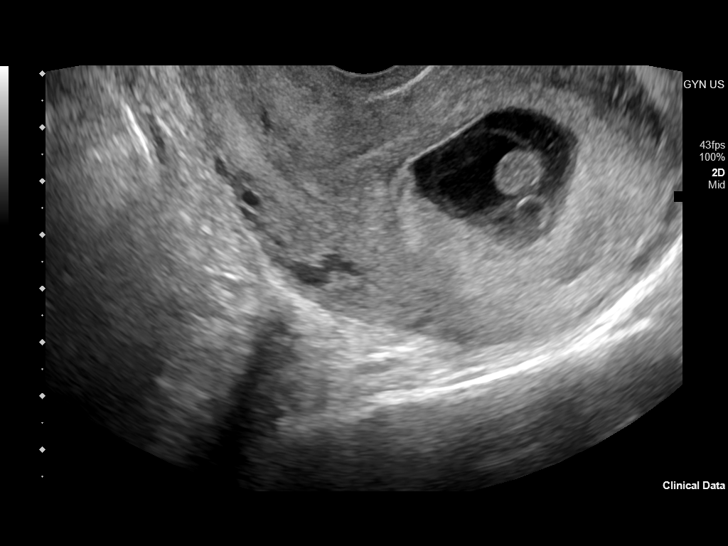
[im 32/95]
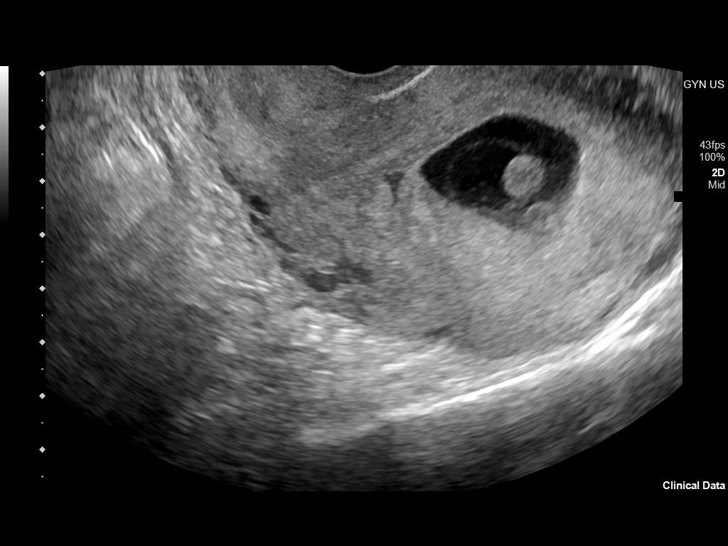
[im 39/95]
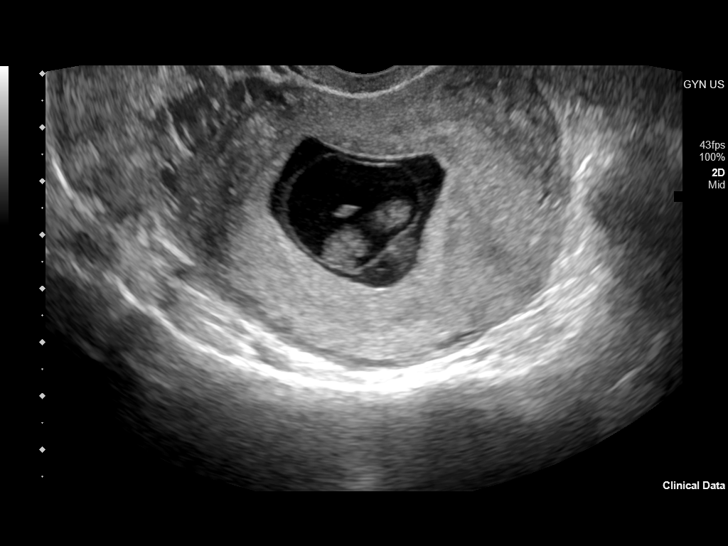
[im 49/95]
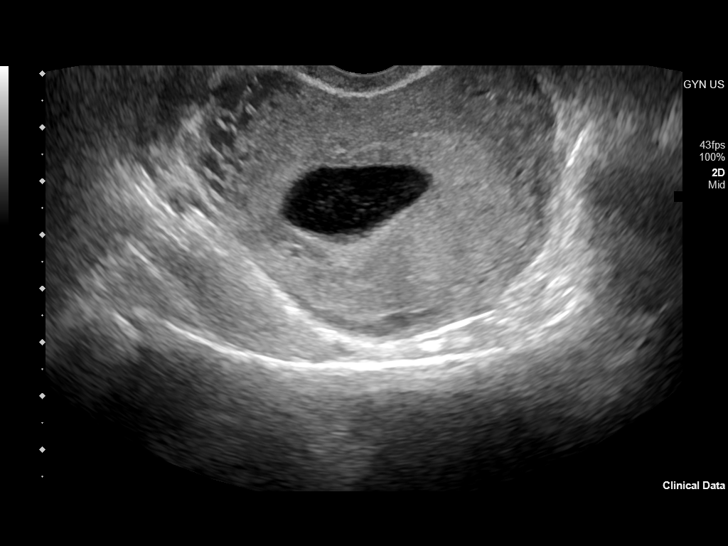
[im 56/95]
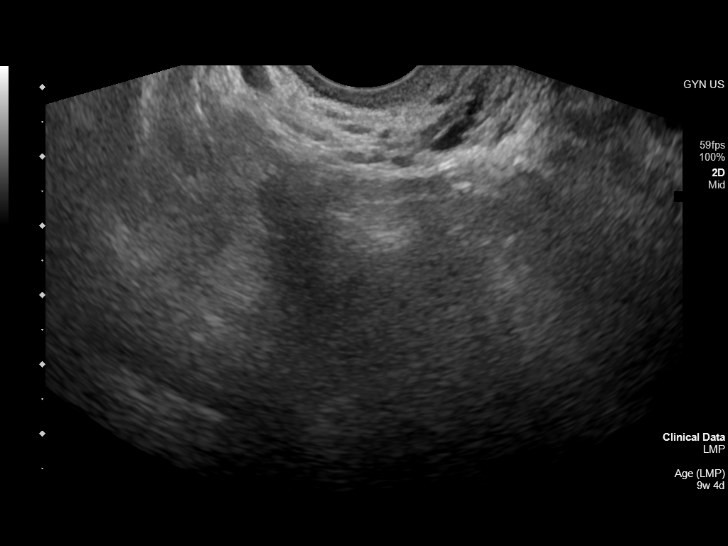
[im 63/95]
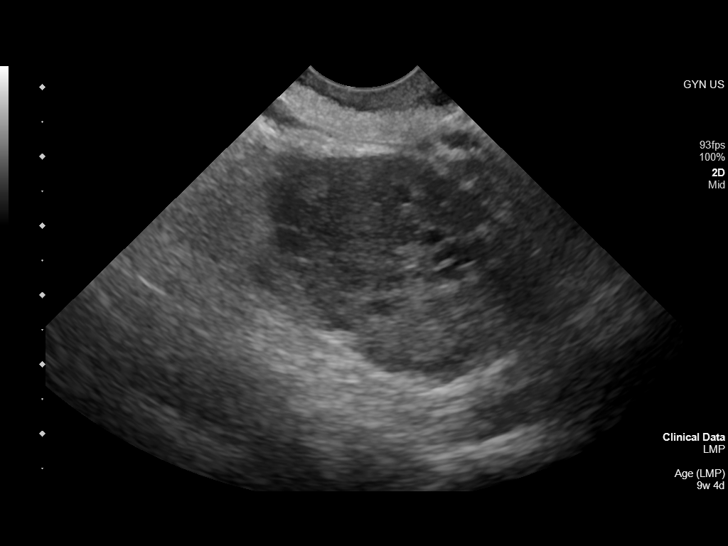
[im 70/95]
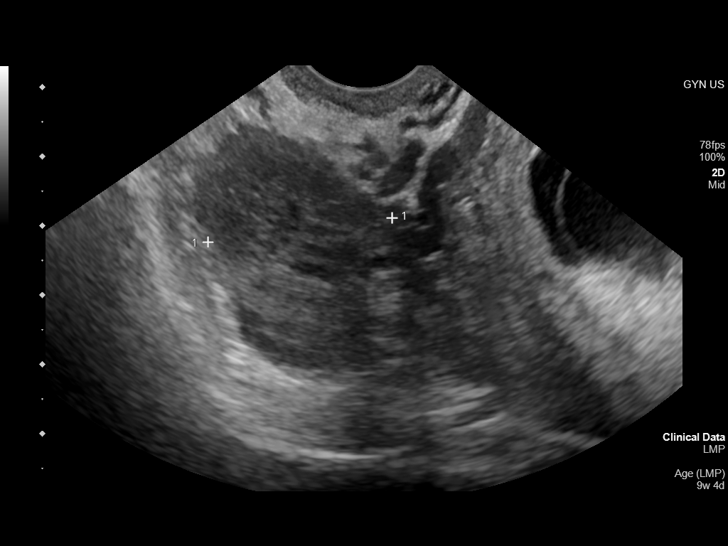
[im 77/95]
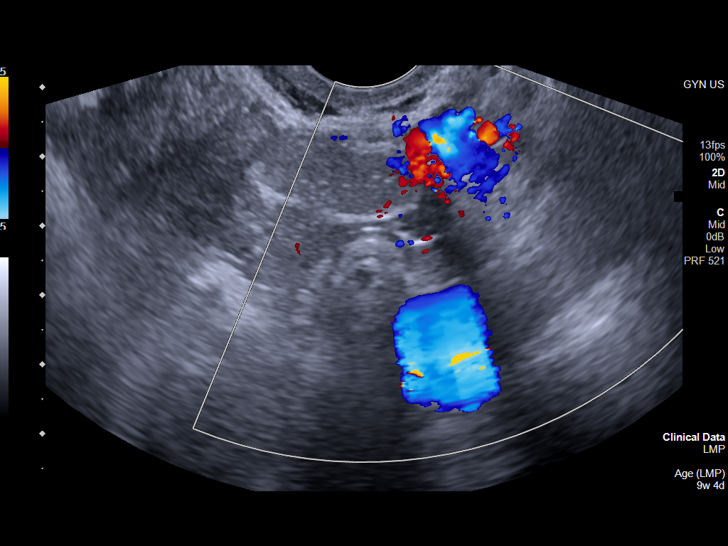
[im 84/95]
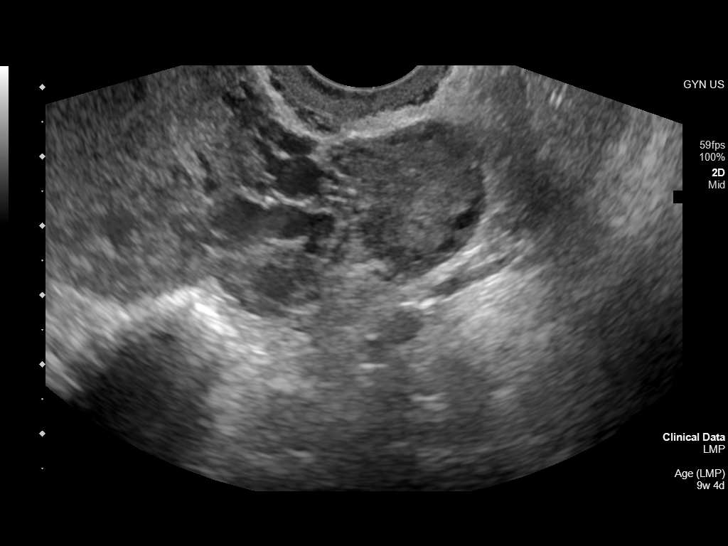
[im 91/95]
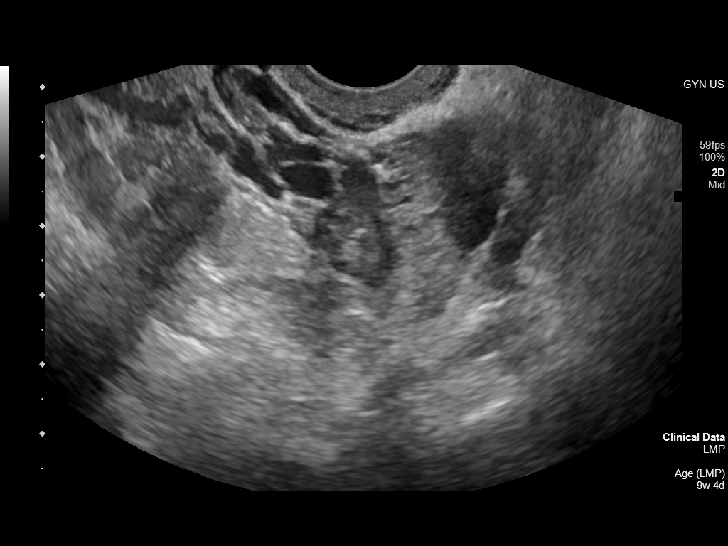

[13 of 28 positions shown; findings below may reference images not displayed]

FINDINGS: Intrauterine gestational sac: Present, single

Yolk sac:  Present

Embryo:  Present

Cardiac Activity: Absent

Heart Rate: N/A  bpm

CRL:  19.3 mm   8 w   3 d                  US EDC: 04/07/2022

Subchorionic hemorrhage:  None visualized.

Maternal uterus/adnexae:

Uterus retroverted, otherwise normal appearance.

RIGHT ovary normal size and morphology, 4.0 x 2.8 x 2.7 cm.

LEFT ovary normal size and morphology, 4.4 x 2.4 x 1.9 cm.

No free pelvic fluid or adnexal masses.
IMPRESSION: Intrauterine gestational sac containing a yolk sac and a fetal pole,
crown-rump length corresponding to 8 weeks 3 days EGA.

Absent fetal cardiac activity.

Findings meet definitive criteria for failed pregnancy. This follows
SRU consensus guidelines: Diagnostic Criteria for Nonviable
Pregnancy Early in the First Trimester. N Engl J Med

## 2023-02-12 MED ORDER — FLUCONAZOLE 150 MG PO TABS: 150.0000 mg | ORAL_TABLET | Freq: Once | ORAL | 0 refills | Status: AC

## 2023-02-12 NOTE — Telephone Encounter (Signed)
Metronidazole for positive BV Diflucan for positive candida

## 2023-02-13 ENCOUNTER — Telehealth (HOSPITAL_COMMUNITY): Payer: Self-pay | Admitting: Emergency Medicine

## 2023-02-13 MED ORDER — AZITHROMYCIN 250 MG PO TABS: 250.0000 mg | ORAL_TABLET | Freq: Every day | ORAL | 0 refills | Status: DC

## 2023-02-13 NOTE — Telephone Encounter (Signed)
Patient endorses continue sore throat Will send Azithromycin, per protocl Reviewed with patient, verified pharmacy, prescription sent

## 2023-03-02 ENCOUNTER — Encounter (HOSPITAL_COMMUNITY): Payer: Self-pay

## 2023-03-02 ENCOUNTER — Ambulatory Visit (HOSPITAL_COMMUNITY)
Admission: EM | Admit: 2023-03-02 | Discharge: 2023-03-02 | Disposition: A | Discharge status: Home / Self Care | Payer: BLUE CROSS/BLUE SHIELD | Attending: Internal Medicine | Admitting: Internal Medicine

## 2023-03-02 DIAGNOSIS — Z3202 Encounter for pregnancy test, result negative: Secondary | ICD-10-CM | POA: Diagnosis present

## 2023-03-02 DIAGNOSIS — N760 Acute vaginitis: Secondary | ICD-10-CM | POA: Diagnosis present

## 2023-03-02 LAB — POCT URINE PREGNANCY: Preg Test, Ur: NEGATIVE

## 2023-03-02 NOTE — ED Triage Notes (Signed)
Pt states she had positive pregnancy test at home and would like to be retested. Also states she had BV and would like to be retested because she is still having discharge after finishing her medication.

## 2023-03-02 NOTE — Discharge Instructions (Addendum)
STD testing pending, this will take 2-3 days to result. We will only call you if your testing is positive for any infection(s) and we will provide treatment.  Pregnancy negative!   Avoid sexual intercourse until your STD results come back.  If any of your STD results are positive, you will need to avoid sexual intercourse for 7 days while you are being treated to prevent spread of STD.  Condom use is the best way to prevent spread of STDs. Notify partner(s) of any positive results.  Return to urgent care as needed.

## 2023-03-02 NOTE — ED Provider Notes (Signed)
[facility]    CSN: [id number] Arrival date & time: 03/02/23  1622      History   Chief Complaint Chief Complaint  Patient presents with   Possible Pregnancy    HPI Dorothy Wallace is a 22 y.o. female.   Dorothy Wallace is a 22 y.o. female presenting for chief complaint of possible pregnancy. She took a pregnancy test at home and it was positive. LMP started 01/28/2023. She is sexually active with female partner without protection. Requesting pregnancy testing here to confirm. Denies nausea, vomiting, diarrhea, abdominal pain, urinary symptoms.  She is experiencing persistent vaginitis symptoms since receiving treatment for bacterial vaginitis with Flagyl 2 weeks ago.  She took antibiotic as prescribed and states her vaginal discharge/odor has improved, however remains persistent.  She would like to be tested for STDs today as she has been sexually active with new female partner and is unsure of any known exposures to STD.  No vaginal itching reported or recent vaginal bleeding.  Recently tested for HIV and syphilis in July 2024, declines this testing today.   Possible Pregnancy    Past Medical History:  Diagnosis Date   Environmental allergies    History of chlamydia    History of gonorrhea    History of miscarriage     Patient Active Problem List   Diagnosis Date Noted   Miscarriage 09/15/2021   BV (bacterial vaginosis) 09/13/2021   Vaginal discharge 07/08/2021   Nipple discharge 07/08/2021   Well woman exam 07/08/2021   COVID-19 11/10/2019   Pregnancy of unknown anatomic location 08/23/2019   Chlamydia infection 10/09/2017   Seasonal allergic rhinitis due to pollen 10/08/2017   Dysthymia 01/21/2016   Oppositional defiant disorder 09/15/2013    History reviewed. No pertinent surgical history.  OB History   No obstetric history on file.      Home Medications    Prior to Admission medications   Medication Sig Start Date End Date Taking? Authorizing Provider   azithromycin (ZITHROMAX) 250 MG tablet Take 1 tablet (250 mg total) by mouth daily. Take first 2 tablets together, then 1 every day until finished. 02/13/23   Merrilee Jansky, MD  ibuprofen (ADVIL) 600 MG tablet Take 600 mg by mouth 3 (three) times daily. 09/01/21   [provider]  JUNEL FE 1/20 1-20 MG-MCG tablet Take 1 tablet by mouth daily. 09/15/21   [provider]  metroNIDAZOLE (FLAGYL) 500 MG tablet Take 1 tablet (500 mg total) by mouth 2 (two) times daily. 02/09/23   Radford Pax, NP  Multiple Vitamin (MULTI-VITAMIN PO) Take 1 tablet by mouth daily.    [provider]  Multiple Vitamins-Minerals (KP ADULTS DAILY FORMULA) TABS Take by mouth. 01/10/18   [provider]    Family History Family History  Problem Relation Age of Onset   Stroke Mother    Multiple sclerosis Father     Social History Social History   Tobacco Use   Smoking status: Never   Smokeless tobacco: Never  Vaping Use   Vaping status: Never Used  Substance Use Topics   Alcohol use: Yes   Drug use: Never     Allergies   Gramineae pollens and Pollen extract   Review of Systems Review of Systems Per HPI  Physical Exam Triage Vital Signs ED Triage Vitals  Encounter Vitals Group     BP 03/02/23 1733 124/76     Systolic BP Percentile --      Diastolic BP Percentile --  Pulse Rate 03/02/23 1733 62     Resp 03/02/23 1733 16     Temp 03/02/23 1733 98.3 F (36.8 C)     Temp Source 03/02/23 1733 Oral     SpO2 03/02/23 1733 100 %     Weight --      Height --      Head Circumference --      Peak Flow --      Pain Score 03/02/23 1734 0     Pain Loc --      Pain Education --      Exclude from Growth Chart --    No data found.  Updated Vital Signs BP 124/76 (BP Location: Left Arm)   Pulse 62   Temp 98.3 F (36.8 C) (Oral)   Resp 16   LMP 01/28/2023 (Approximate)   SpO2 100%   Visual Acuity Right Eye Distance:   Left Eye Distance:   Bilateral  Distance:    Right Eye Near:   Left Eye Near:    Bilateral Near:     Physical Exam Vitals and nursing note reviewed.  Constitutional:      Appearance: She is not ill-appearing or toxic-appearing.  HENT:     Head: Normocephalic and atraumatic.     Right Ear: Hearing and external ear normal.     Left Ear: Hearing and external ear normal.     Nose: Nose normal.     Mouth/Throat:     Lips: Pink.  Eyes:     General: Lids are normal. Vision grossly intact. Gaze aligned appropriately.     Extraocular Movements: Extraocular movements intact.     Conjunctiva/sclera: Conjunctivae normal.  Pulmonary:     Effort: Pulmonary effort is normal.  Musculoskeletal:     Cervical back: Neck supple.  Skin:    General: Skin is warm and dry.     Capillary Refill: Capillary refill takes less than 2 seconds.     Findings: No rash.  Neurological:     General: No focal deficit present.     Mental Status: She is alert and oriented to person, place, and time. Mental status is at baseline.     Cranial Nerves: No dysarthria or facial asymmetry.  Psychiatric:        Mood and Affect: Mood normal.        Speech: Speech normal.        Behavior: Behavior normal.        Thought Content: Thought content normal.        Judgment: Judgment normal.      UC Treatments / Results  Labs (all labs ordered are listed, but only abnormal results are displayed) Labs Reviewed  POCT URINE PREGNANCY  CERVICOVAGINAL ANCILLARY ONLY    EKG   Radiology No results found.  Procedures Procedures (including critical care time)  Medications Ordered in UC Medications - No data to display  Initial Impression / Assessment and Plan / UC Course  I have reviewed the triage vital signs and the nursing notes.  Pertinent labs & imaging results that were available during my care of the patient were reviewed by me and considered in my medical decision making (see chart for details).   1.  Acute vaginitis, negative  pregnancy test STI labs pending, will notify patient of positive results and treat accordingly per protocol when labs result.  Will defer HIV and RPR testing today since this was just completed 2 months ago in July 2024. Patient to avoid sexual  intercourse until screening testing comes back.   Education provided regarding safe sexual practices and patient encouraged to use protection to prevent spread of STIs.   Urine pregnancy negative in clinic. Advised to re-test at home in the next 4-5 days should menstrual cycle fail to begin.  Counseled patient on potential for adverse effects with medications prescribed/recommended today, strict ER and return-to-clinic precautions discussed, patient verbalized understanding.    Final Clinical Impressions(s) / UC Diagnoses   Final diagnoses:  Acute vaginitis  Negative pregnancy test     Discharge Instructions      STD testing pending, this will take 2-3 days to result. We will only call you if your testing is positive for any infection(s) and we will provide treatment.  Pregnancy negative!   Avoid sexual intercourse until your STD results come back.  If any of your STD results are positive, you will need to avoid sexual intercourse for 7 days while you are being treated to prevent spread of STD.  Condom use is the best way to prevent spread of STDs. Notify partner(s) of any positive results.  Return to urgent care as needed.      ED Prescriptions   None    PDMP not reviewed this encounter.   Carlisle Beers, [place] 03/02/23 1846

## 2023-03-06 ENCOUNTER — Telehealth: Payer: Self-pay | Admitting: Emergency Medicine

## 2023-03-06 LAB — CERVICOVAGINAL ANCILLARY ONLY
Bacterial Vaginitis (gardnerella): POSITIVE — AB
Candida Glabrata: NEGATIVE
Candida Vaginitis: NEGATIVE
Chlamydia: NEGATIVE
Comment: NEGATIVE
Comment: NEGATIVE
Comment: NEGATIVE
Comment: NEGATIVE
Comment: NEGATIVE
Comment: NORMAL
Neisseria Gonorrhea: NEGATIVE
Trichomonas: NEGATIVE

## 2023-03-06 MED ORDER — METRONIDAZOLE 500 MG PO TABS: 500.0000 mg | ORAL_TABLET | Freq: Two times a day (BID) | ORAL | 0 refills | Status: DC

## 2023-03-06 NOTE — Telephone Encounter (Signed)
Metronidazole for positive BV

## 2023-05-20 ENCOUNTER — Other Ambulatory Visit: Payer: Self-pay

## 2023-05-20 ENCOUNTER — Encounter (HOSPITAL_COMMUNITY): Payer: Self-pay | Admitting: Emergency Medicine

## 2023-05-20 ENCOUNTER — Ambulatory Visit (HOSPITAL_COMMUNITY)
Admission: EM | Admit: 2023-05-20 | Discharge: 2023-05-20 | Disposition: A | Discharge status: Home / Self Care | Payer: Medicaid Other | Attending: Emergency Medicine | Admitting: Emergency Medicine

## 2023-05-20 DIAGNOSIS — R519 Headache, unspecified: Secondary | ICD-10-CM | POA: Diagnosis not present

## 2023-05-20 MED ORDER — DEXAMETHASONE SODIUM PHOSPHATE 10 MG/ML IJ SOLN
INTRAMUSCULAR | Status: AC
  Filled 2023-05-20: qty 1

## 2023-05-20 MED ORDER — METOCLOPRAMIDE HCL 5 MG/ML IJ SOLN
5.0000 mg | Freq: Once | INTRAMUSCULAR | Status: AC
  Administered 2023-05-20: 5 mg via INTRAMUSCULAR

## 2023-05-20 MED ORDER — KETOROLAC TROMETHAMINE 30 MG/ML IJ SOLN
INTRAMUSCULAR | Status: AC
  Filled 2023-05-20: qty 1

## 2023-05-20 MED ORDER — DEXAMETHASONE SODIUM PHOSPHATE 10 MG/ML IJ SOLN
10.0000 mg | Freq: Once | INTRAMUSCULAR | Status: AC
  Administered 2023-05-20: 10 mg via INTRAMUSCULAR

## 2023-05-20 MED ORDER — KETOROLAC TROMETHAMINE 30 MG/ML IJ SOLN
30.0000 mg | Freq: Once | INTRAMUSCULAR | Status: AC
  Administered 2023-05-20: 30 mg via INTRAMUSCULAR

## 2023-05-20 MED ORDER — METOCLOPRAMIDE HCL 5 MG/ML IJ SOLN
INTRAMUSCULAR | Status: AC
  Filled 2023-05-20: qty 2

## 2023-05-20 NOTE — ED Provider Notes (Signed)
[facility]    CSN: [id number] Arrival date & time: 05/20/23  1124      History   Chief Complaint Chief Complaint  Patient presents with   Headache    HPI Dorothy Wallace is a 22 y.o. female.   Patient presents to clinic for an ongoing headache for the past 2 weeks.  She does have a history of headaches in the past, normally does improve with Excedrin.  Initially the headache did respond to Excedrin, but then it would return.  She has tried Tylenol extra strength and ibuprofen 800 mg as well, no improvement.  Describes headache persistent in her forehead and both sides of her temporal area.  No vision changes.  No nausea or vomiting.  No photophobia or phonophobia.  She has had nasal congestion for the past week.  She did recently tested negative for COVID and flu.  Used to get frequent HA and was placed on naproxen by her primary care provider.  The history is provided by the patient and medical records.  Headache   Past Medical History:  Diagnosis Date   Environmental allergies    History of chlamydia    History of gonorrhea    History of miscarriage     Patient Active Problem List   Diagnosis Date Noted   Miscarriage 09/15/2021   BV (bacterial vaginosis) 09/13/2021   Vaginal discharge 07/08/2021   Nipple discharge 07/08/2021   Well woman exam 07/08/2021   COVID-19 11/10/2019   Pregnancy of unknown anatomic location 08/23/2019   Chlamydia infection 10/09/2017   Seasonal allergic rhinitis due to pollen 10/08/2017   Dysthymia 01/21/2016   Oppositional defiant disorder 09/15/2013    History reviewed. No pertinent surgical history.  OB History   No obstetric history on file.      Home Medications    Prior to Admission medications   Medication Sig Start Date End Date Taking? Authorizing Provider  azithromycin (ZITHROMAX) 250 MG tablet Take 1 tablet (250 mg total) by mouth daily. Take first 2 tablets together, then 1 every day until  finished. Patient not taking: Reported on 05/20/2023 02/13/23   Merrilee Jansky, MD  ibuprofen (ADVIL) 600 MG tablet Take 600 mg by mouth 3 (three) times daily. 09/01/21   [provider]  JUNEL FE 1/20 1-20 MG-MCG tablet Take 1 tablet by mouth daily. Patient not taking: Reported on 05/20/2023 09/15/21   [provider]  metroNIDAZOLE (FLAGYL) 500 MG tablet Take 1 tablet (500 mg total) by mouth 2 (two) times daily. Patient not taking: Reported on 05/20/2023 03/06/23   Merrilee Jansky, MD  Multiple Vitamin (MULTI-VITAMIN PO) Take 1 tablet by mouth daily. Patient not taking: Reported on 05/20/2023    [provider]  Multiple Vitamins-Minerals (KP ADULTS DAILY FORMULA) TABS Take by mouth. Patient not taking: Reported on 05/20/2023 01/10/18   [provider]    Family History Family History  Problem Relation Age of Onset   Stroke Mother    Multiple sclerosis Father     Social History Social History   Tobacco Use   Smoking status: Never   Smokeless tobacco: Never  Vaping Use   Vaping status: Never Used  Substance Use Topics   Alcohol use: Yes   Drug use: Never     Allergies   Gramineae pollens and Pollen extract   Review of Systems Review of Systems  Per HPI   Physical Exam Triage Vital Signs ED Triage Vitals  Encounter Vitals Group  BP 05/20/23 1230 (!) 133/91     Systolic BP Percentile --      Diastolic BP Percentile --      Pulse Rate 05/20/23 1230 69     Resp 05/20/23 1230 18     Temp 05/20/23 1230 98.3 F (36.8 C)     Temp Source 05/20/23 1230 Oral     SpO2 05/20/23 1230 96 %     Weight --      Height --      Head Circumference --      Peak Flow --      Pain Score 05/20/23 1227 8     Pain Loc --      Pain Education --      Exclude from Growth Chart --    No data found.  Updated Vital Signs BP (!) 133/91 (BP Location: Left Arm)   Pulse 69   Temp 98.3 F (36.8 C) (Oral)   Resp 18   LMP 05/04/2023 (Exact Date)    SpO2 96%   Visual Acuity Right Eye Distance:   Left Eye Distance:   Bilateral Distance:    Right Eye Near:   Left Eye Near:    Bilateral Near:     Physical Exam Vitals and nursing note reviewed.  Constitutional:      Appearance: Normal appearance. She is well-developed.  HENT:     Head: Normocephalic and atraumatic.     Right Ear: External ear normal.     Left Ear: External ear normal.     Nose: Congestion present.     Mouth/Throat:     Mouth: Mucous membranes are moist.  Eyes:     General: No scleral icterus.    Extraocular Movements: Extraocular movements intact.     Conjunctiva/sclera: Conjunctivae normal.     Pupils: Pupils are equal, round, and reactive to light. Pupils are equal.  Cardiovascular:     Rate and Rhythm: Normal rate.  Pulmonary:     Effort: Pulmonary effort is normal. No respiratory distress.  Musculoskeletal:        General: Normal range of motion.     Cervical back: Normal range of motion.  Skin:    General: Skin is warm and dry.  Neurological:     General: No focal deficit present.     Mental Status: She is alert.     GCS: GCS eye subscore is 4. GCS verbal subscore is 5. GCS motor subscore is 6.     Cranial Nerves: No cranial nerve deficit or dysarthria.     Coordination: Coordination normal.  Psychiatric:        Mood and Affect: Mood normal.      UC Treatments / Results  Labs (all labs ordered are listed, but only abnormal results are displayed) Labs Reviewed - No data to display  EKG   Radiology No results found.  Procedures Procedures (including critical care time)  Medications Ordered in UC Medications  ketorolac (TORADOL) 30 MG/ML injection 30 mg (has no administration in time range)  metoCLOPramide (REGLAN) injection 5 mg (has no administration in time range)  dexamethasone (DECADRON) injection 10 mg (has no administration in time range)    Initial Impression / Assessment and Plan / UC Course  I have reviewed the triage  vital signs and the nursing notes.  Pertinent labs & imaging results that were available during my care of the patient were reviewed by me and considered in my medical decision making (see chart for details).  Vitals and triage reviewed, patient is hemodynamically stable.  PERRLA, EOMI. cranial nerves II through XII grossly intact.  No vision changes, photophobia or phonophobia.  History of headaches.  Will trial migraine cocktail, strict emergency precautions given if no improvement or any changes.  Patient verbalized understanding, no questions at this time.     Final Clinical Impressions(s) / UC Diagnoses   Final diagnoses:  Bad headache     Discharge Instructions      We have given you a combination of medications to treat your headache.  Please go home, get some rest and drink plenty of water.  If these medications do not improve your headache, you have any worsening of your headache, vision changes or any new concerning symptoms follow-up at the nearest emergency department for further evaluation.  If your headaches become recurrent again, please follow-up with her primary care provider.     ED Prescriptions   None    PDMP not reviewed this encounter.   Ayasha Ellingsen, [place], [place] 05/20/23 1254

## 2023-05-20 NOTE — Discharge Instructions (Addendum)
We have given you a combination of medications to treat your headache.  Please go home, get some rest and drink plenty of water.  If these medications do not improve your headache, you have any worsening of your headache, vision changes or any new concerning symptoms follow-up at the nearest emergency department for further evaluation.  If your headaches become recurrent again, please follow-up with her primary care provider.

## 2023-05-20 NOTE — ED Triage Notes (Signed)
Headache for 2 weeks.  Took 2 excedrin at 7:40 this morning.  Head continues hurting.  Reports having headaches for 2 weeks.  Points to either side of forehead as location of pain.  Denies any uri.  Has had covid and flu tests in the last 2 weeks and reported as negative.

## 2023-06-21 DIAGNOSIS — R109 Unspecified abdominal pain: Secondary | ICD-10-CM | POA: Diagnosis present

## 2023-06-21 DIAGNOSIS — B9689 Other specified bacterial agents as the cause of diseases classified elsewhere: Secondary | ICD-10-CM | POA: Diagnosis not present

## 2023-06-21 DIAGNOSIS — K5909 Other constipation: Secondary | ICD-10-CM | POA: Insufficient documentation

## 2023-06-21 DIAGNOSIS — N760 Acute vaginitis: Secondary | ICD-10-CM | POA: Diagnosis not present

## 2023-06-22 ENCOUNTER — Other Ambulatory Visit: Payer: Self-pay

## 2023-06-22 ENCOUNTER — Emergency Department
Admission: EM | Admit: 2023-06-22 | Discharge: 2023-06-22 | Disposition: A | Discharge status: Home / Self Care | Payer: Medicaid Other | Attending: Emergency Medicine | Admitting: Emergency Medicine

## 2023-06-22 ENCOUNTER — Encounter: Payer: Self-pay | Admitting: Emergency Medicine

## 2023-06-22 DIAGNOSIS — K5909 Other constipation: Secondary | ICD-10-CM

## 2023-06-22 DIAGNOSIS — B9689 Other specified bacterial agents as the cause of diseases classified elsewhere: Secondary | ICD-10-CM

## 2023-06-22 LAB — CBC WITH DIFFERENTIAL/PLATELET
Abs Immature Granulocytes: 0 10*3/uL (ref 0.00–0.07)
Basophils Absolute: 0 10*3/uL (ref 0.0–0.1)
Basophils Relative: 1 %
Eosinophils Absolute: 0.1 10*3/uL (ref 0.0–0.5)
Eosinophils Relative: 1 %
HCT: 37.3 % (ref 36.0–46.0)
Hemoglobin: 11.7 g/dL — ABNORMAL LOW (ref 12.0–15.0)
Immature Granulocytes: 0 %
Lymphocytes Relative: 43 %
Lymphs Abs: 1.7 10*3/uL (ref 0.7–4.0)
MCH: 25.3 pg — ABNORMAL LOW (ref 26.0–34.0)
MCHC: 31.4 g/dL (ref 30.0–36.0)
MCV: 80.6 fL (ref 80.0–100.0)
Monocytes Absolute: 0.3 10*3/uL (ref 0.1–1.0)
Monocytes Relative: 8 %
Neutro Abs: 1.8 10*3/uL (ref 1.7–7.7)
Neutrophils Relative %: 47 %
Platelets: 234 10*3/uL (ref 150–400)
RBC: 4.63 MIL/uL (ref 3.87–5.11)
RDW: 17.5 % — ABNORMAL HIGH (ref 11.5–15.5)
WBC: 3.9 10*3/uL — ABNORMAL LOW (ref 4.0–10.5)
nRBC: 0 % (ref 0.0–0.2)

## 2023-06-22 LAB — COMPREHENSIVE METABOLIC PANEL
ALT: 18 U/L (ref 0–44)
AST: 22 U/L (ref 15–41)
Albumin: 4.5 g/dL (ref 3.5–5.0)
Alkaline Phosphatase: 50 U/L (ref 38–126)
Anion gap: 13 (ref 5–15)
BUN: 12 mg/dL (ref 6–20)
CO2: 20 mmol/L — ABNORMAL LOW (ref 22–32)
Calcium: 9.5 mg/dL (ref 8.9–10.3)
Chloride: 103 mmol/L (ref 98–111)
Creatinine, Ser: 0.68 mg/dL (ref 0.44–1.00)
GFR, Estimated: >60 mL/min (ref >60)
Glucose, Bld: 75 mg/dL (ref 70–99)
Potassium: 3.6 mmol/L (ref 3.5–5.1)
Sodium: 136 mmol/L (ref 135–145)
Total Bilirubin: 0.4 mg/dL (ref 0.0–1.2)
Total Protein: 8.6 g/dL — ABNORMAL HIGH (ref 6.5–8.1)

## 2023-06-22 LAB — URINALYSIS, ROUTINE W REFLEX MICROSCOPIC
Bilirubin Urine: NEGATIVE
Glucose, UA: NEGATIVE mg/dL
Hgb urine dipstick: NEGATIVE
Ketones, ur: NEGATIVE mg/dL
Leukocytes,Ua: NEGATIVE
Nitrite: NEGATIVE
Protein, ur: NEGATIVE mg/dL
Specific Gravity, Urine: 1.024 (ref 1.005–1.030)
pH: 6 (ref 5.0–8.0)

## 2023-06-22 LAB — CHLAMYDIA/NGC RT PCR (ARMC ONLY)
Chlamydia Tr: NOT DETECTED
N gonorrhoeae: NOT DETECTED

## 2023-06-22 LAB — WET PREP, GENITAL
Sperm: NONE SEEN
Trich, Wet Prep: NONE SEEN
WBC, Wet Prep HPF POC: <10 (ref <10)
Yeast Wet Prep HPF POC: NONE SEEN

## 2023-06-22 LAB — POC URINE PREG, ED: Preg Test, Ur: NEGATIVE

## 2023-06-22 LAB — LIPASE, BLOOD: Lipase: 32 U/L (ref 11–51)

## 2023-06-22 MED ORDER — KETOROLAC TROMETHAMINE 30 MG/ML IJ SOLN
30.0000 mg | Freq: Once | INTRAMUSCULAR | Status: AC
  Administered 2023-06-22: 30 mg via INTRAMUSCULAR
  Filled 2023-06-22: qty 1

## 2023-06-22 MED ORDER — DICYCLOMINE HCL 10 MG PO CAPS
10.0000 mg | ORAL_CAPSULE | Freq: Once | ORAL | Status: AC
  Administered 2023-06-22: 10 mg via ORAL
  Filled 2023-06-22: qty 1

## 2023-06-22 MED ORDER — METRONIDAZOLE 500 MG PO TABS: 500.0000 mg | ORAL_TABLET | Freq: Two times a day (BID) | ORAL | 0 refills | Status: AC

## 2023-06-22 MED ORDER — METRONIDAZOLE 500 MG PO TABS
500.0000 mg | ORAL_TABLET | Freq: Once | ORAL | Status: AC
  Administered 2023-06-22: 500 mg via ORAL
  Filled 2023-06-22: qty 1

## 2023-06-22 NOTE — ED Notes (Signed)
 Patient declined discharge vital signs.

## 2023-06-22 NOTE — Discharge Instructions (Addendum)
 You do have signs of recurrence of your bacterial vaginosis over discharging with another course of metronidazole /Flagyl .  Please do not drink alcohol while taking this medication.  Follow-up with your OB/GYN  As we discussed, try higher doses of MiraLAX to help with your chronic constipation  If your abdominal pain worsens despite these measures, please return to the ED.

## 2023-06-22 NOTE — ED Provider Notes (Signed)
 [facility] Provider Note    Event Date/Time   First MD Initiated Contact with Patient 06/22/23 0501     (approximate)   History   Abdominal Pain   HPI  Dorothy Wallace is a 23 y.o. female who presents to the ED for evaluation of Abdominal Pain   I review OB/GYN clinic from November.  Obese patient , G2A2.  Previous STIs.  Chronic constipation.  Patient presents to the ED for evaluation of acute on chronic lower abdominal pain.  Reports that this feels similar as when she has had BV in the past and she does report some concurrent increased vaginal discharge.  2 weeks since LMP.  At least 5 days since her last bowel movement.  She reports maybe going once per week.  No fevers, emesis, urinary changes   Physical Exam   Triage Vital Signs: ED Triage Vitals  Encounter Vitals Group     BP 06/22/23 0029 125/69     Systolic BP Percentile --      Diastolic BP Percentile --      Pulse Rate 06/22/23 0029 86     Resp 06/22/23 0029 18     Temp 06/22/23 0029 98 F (36.7 C)     Temp Source 06/22/23 0029 Oral     SpO2 06/22/23 0029 100 %     Weight 06/22/23 0023 231 lb (104.8 kg)     Height 06/22/23 0023 5' 9 (1.753 m)     Head Circumference --      Peak Flow --      Pain Score 06/22/23 0023 9     Pain Loc --      Pain Education --      Exclude from Growth Chart --     Most recent vital signs: Vitals:   06/22/23 0029 06/22/23 0623  BP: 125/69 (!) 137/92  Pulse: 86 65  Resp: 18 18  Temp: 98 F (36.7 C) 98.3 F (36.8 C)  SpO2: 100% 100%    General: Awake, no distress.  Obese, pleasant and conversational.  Looks well CV:  Good peripheral perfusion.  Resp:  Normal effort.  Abd:  No distention.  Soft and benign throughout.  No particular tenderness MSK:  No deformity noted.  Neuro:  No focal deficits appreciated. Other:     ED Results / Procedures / Treatments   Labs (all labs ordered are listed, but only abnormal results are  displayed) Labs Reviewed  WET PREP, GENITAL - Abnormal; Notable for the following components:      Result Value   Clue Cells Wet Prep HPF POC PRESENT (*)    All other components within normal limits  CBC WITH DIFFERENTIAL/PLATELET - Abnormal; Notable for the following components:   WBC 3.9 (*)    Hemoglobin 11.7 (*)    MCH 25.3 (*)    RDW 17.5 (*)    All other components within normal limits  COMPREHENSIVE METABOLIC PANEL - Abnormal; Notable for the following components:   CO2 20 (*)    Total Protein 8.6 (*)    All other components within normal limits  URINALYSIS, ROUTINE W REFLEX MICROSCOPIC - Abnormal; Notable for the following components:   Color, Urine YELLOW (*)    APPearance CLEAR (*)    All other components within normal limits  CHLAMYDIA/NGC RT PCR ([facility] ONLY)            LIPASE, BLOOD  POC URINE PREG, ED    EKG   RADIOLOGY  Official radiology report(s): No results found.  PROCEDURES and INTERVENTIONS:  Procedures  Medications  metroNIDAZOLE  (FLAGYL ) tablet 500 mg (has no administration in time range)  dicyclomine  (BENTYL ) capsule 10 mg (10 mg Oral Given 06/22/23 0650)  ketorolac  (TORADOL ) 30 MG/ML injection 30 mg (30 mg Intramuscular Given 06/22/23 0649)     IMPRESSION / MDM / ASSESSMENT AND PLAN / ED COURSE  I reviewed the triage vital signs and the nursing notes.  Differential diagnosis includes, but is not limited to, BV, STI, constipation, appendicitis  {Patient presents with symptoms of an acute illness or injury that is potentially life-threatening.  Patient presents with acute on chronic lower abdominal pain with increased vaginal discharge.  Normal CBC, metabolic panel and lipase.  Not pregnant.  Self swab for a wet prep and GC to assess for recurrence of BV.   Wet prep does show signs of clue cells concerning for recurrence of her BV.  We will start her on Flagyl .  Will have her follow-up with her gynecologist.  Discussed management of chronic  constipation with the patient.  I consider cross-sectional imaging of her abdomen but considering her lack of tenderness, chronicity of pain, typical nature of pain and normal blood work I do not think this is necessary.     FINAL CLINICAL IMPRESSION(S) / ED DIAGNOSES   Final diagnoses:  Bacterial vaginosis  Chronic constipation     Rx / DC Orders   ED Discharge Orders          Ordered    metroNIDAZOLE  (FLAGYL ) 500 MG tablet  2 times daily        06/22/23 0706             Note:  This document was prepared using Dragon voice recognition software and may include unintentional dictation errors.   Claudene Rover, MD 06/22/23 [id number]

## 2023-06-22 NOTE — ED Triage Notes (Signed)
 Patient ambulatory to triage with steady gait, without difficulty or distress noted; pt reports x mo having lower abd pain radiating around to back accomp by urinary frequency

## 2023-08-01 ENCOUNTER — Encounter (HOSPITAL_COMMUNITY): Payer: Self-pay

## 2023-08-01 ENCOUNTER — Ambulatory Visit (HOSPITAL_COMMUNITY)
Admission: EM | Admit: 2023-08-01 | Discharge: 2023-08-01 | Disposition: A | Discharge status: Home / Self Care | Payer: Medicaid Other | Attending: Physician Assistant | Admitting: Physician Assistant

## 2023-08-01 DIAGNOSIS — N898 Other specified noninflammatory disorders of vagina: Secondary | ICD-10-CM | POA: Diagnosis present

## 2023-08-01 MED ORDER — FLUCONAZOLE 150 MG PO TABS: ORAL_TABLET | ORAL | 0 refills | Status: DC

## 2023-08-01 MED ORDER — METRONIDAZOLE 1 % EX GEL: Freq: Every day | CUTANEOUS | 0 refills | Status: DC

## 2023-08-01 NOTE — ED Triage Notes (Signed)
Patient reports that she has been having a thick white foul smelling vaginal discharge x 1 1/2 weeks. Patient reports that she has had frequent episodes of BV. Patient also c/o vaginal itching.

## 2023-08-01 NOTE — ED Provider Notes (Signed)
[facility]    CSN: [id number] Arrival date & time: 08/01/23  1640      History   Chief Complaint Chief Complaint  Patient presents with   Vaginal Discharge    HPI Dorothy Wallace is a 23 y.o. female.   Patient here today for evaluation of thick vaginal discharge and vaginal itching that started about a week and a half ago. She has history of BV and reports discharge is malodorous. She was recently evaluated by GYN and had STD screening. She denies any known STD exposures.   The history is provided by the patient.    Past Medical History:  Diagnosis Date   Environmental allergies    History of chlamydia    History of gonorrhea    History of miscarriage     Patient Active Problem List   Diagnosis Date Noted   Miscarriage 09/15/2021   BV (bacterial vaginosis) 09/13/2021   Vaginal discharge 07/08/2021   Nipple discharge 07/08/2021   Well woman exam 07/08/2021   COVID-19 11/10/2019   Pregnancy of unknown anatomic location 08/23/2019   Chlamydia infection 10/09/2017   Seasonal allergic rhinitis due to pollen 10/08/2017   Dysthymia 01/21/2016   Oppositional defiant disorder 09/15/2013    History reviewed. No pertinent surgical history.  OB History   No obstetric history on file.      Home Medications    Prior to Admission medications   Medication Sig Start Date End Date Taking? Authorizing Provider  fluconazole (DIFLUCAN) 150 MG tablet Take one tab PO today and repeat dose in 3 days if symptoms persist 08/01/23  Yes Tomi Bamberger, PA-C  metroNIDAZOLE (METROGEL) 1 % gel Apply topically daily. 08/01/23  Yes Tomi Bamberger, PA-C  ibuprofen (ADVIL) 600 MG tablet Take 600 mg by mouth 3 (three) times daily. 09/01/21   [provider]  Multiple Vitamin (MULTI-VITAMIN PO) Take 1 tablet by mouth daily. Patient not taking: Reported on 05/20/2023    [provider]  Multiple Vitamins-Minerals (KP ADULTS DAILY FORMULA) TABS Take by  mouth. Patient not taking: Reported on 05/20/2023 01/10/18   [provider]    Family History Family History  Problem Relation Age of Onset   Stroke Mother    Multiple sclerosis Father     Social History Social History   Tobacco Use   Smoking status: Never   Smokeless tobacco: Never  Vaping Use   Vaping status: Never Used  Substance Use Topics   Alcohol use: Yes   Drug use: Never     Allergies   Gramineae pollens and Pollen extract   Review of Systems Review of Systems  Constitutional:  Negative for chills and fever.  Eyes:  Negative for discharge and redness.  Respiratory:  Negative for shortness of breath.   Gastrointestinal:  Negative for abdominal pain, nausea and vomiting.  Genitourinary:  Positive for vaginal discharge. Negative for genital sores.     Physical Exam Triage Vital Signs ED Triage Vitals  Encounter Vitals Group     BP      Systolic BP Percentile      Diastolic BP Percentile      Pulse      Resp      Temp      Temp src      SpO2      Weight      Height      Head Circumference      Peak Flow      Pain Score  Pain Loc      Pain Education      Exclude from Growth Chart    No data found.  Updated Vital Signs BP 136/77 (BP Location: Right Arm)   Pulse 85   Temp 98.1 F (36.7 C) (Oral)   Resp 16   LMP 07/03/2023   SpO2 100%   Visual Acuity Right Eye Distance:   Left Eye Distance:   Bilateral Distance:    Right Eye Near:   Left Eye Near:    Bilateral Near:     Physical Exam Vitals and nursing note reviewed.  Constitutional:      General: She is not in acute distress.    Appearance: Normal appearance. She is not ill-appearing.  HENT:     Head: Normocephalic and atraumatic.  Eyes:     Conjunctiva/sclera: Conjunctivae normal.  Cardiovascular:     Rate and Rhythm: Normal rate.  Pulmonary:     Effort: Pulmonary effort is normal. No respiratory distress.  Neurological:     Mental Status: She is alert.   Psychiatric:        Mood and Affect: Mood normal.        Behavior: Behavior normal.        Thought Content: Thought content normal.      UC Treatments / Results  Labs (all labs ordered are listed, but only abnormal results are displayed) Labs Reviewed  CERVICOVAGINAL ANCILLARY ONLY    EKG   Radiology No results found.  Procedures Procedures (including critical care time)  Medications Ordered in UC Medications - No data to display  Initial Impression / Assessment and Plan / UC Course  I have reviewed the triage vital signs and the nursing notes.  Pertinent labs & imaging results that were available during my care of the patient were reviewed by me and considered in my medical decision making (see chart for details).    Will treat to cover BV and yeast and will order screening for same as well as for GC, chlamydia and trichomonas. Patient requested metrogel instead of metronidazole orally. Encouraged follow up with any further concerns, otherwise will await results for further recommendation.   Final Clinical Impressions(s) / UC Diagnoses   Final diagnoses:  Vaginal discharge   Discharge Instructions   None    ED Prescriptions     Medication Sig Dispense Auth. Provider   metroNIDAZOLE (METROGEL) 1 % gel Apply topically daily. 45 g Erma Pinto F, PA-C   fluconazole (DIFLUCAN) 150 MG tablet Take one tab PO today and repeat dose in 3 days if symptoms persist 2 tablet Tomi Bamberger, PA-C      PDMP not reviewed this encounter.   Tomi Bamberger, PA-C 08/01/23 1836

## 2023-08-02 LAB — CERVICOVAGINAL ANCILLARY ONLY
Bacterial Vaginitis (gardnerella): POSITIVE — AB
Candida Glabrata: NEGATIVE
Candida Vaginitis: NEGATIVE
Chlamydia: NEGATIVE
Comment: NEGATIVE
Comment: NEGATIVE
Comment: NEGATIVE
Comment: NEGATIVE
Comment: NEGATIVE
Comment: NORMAL
Neisseria Gonorrhea: NEGATIVE
Trichomonas: NEGATIVE

## 2023-08-03 ENCOUNTER — Telehealth: Payer: Medicaid Other | Admitting: Family Medicine

## 2023-08-03 ENCOUNTER — Telehealth (HOSPITAL_BASED_OUTPATIENT_CLINIC_OR_DEPARTMENT_OTHER): Payer: Self-pay

## 2023-08-03 DIAGNOSIS — B3731 Acute candidiasis of vulva and vagina: Secondary | ICD-10-CM | POA: Diagnosis not present

## 2023-08-03 MED ORDER — METRONIDAZOLE 0.75 % VA GEL: 1.0000 | Freq: Every day | VAGINAL | 0 refills | Status: AC

## 2023-08-03 NOTE — Telephone Encounter (Signed)
Pt called stating when she picked up her meds, she had received topical metronidazole without applicators.  Metrogel sent per protocol.

## 2023-08-16 MED ORDER — FLUCONAZOLE 150 MG PO TABS
150.0000 mg | ORAL_TABLET | Freq: Every day | ORAL | 0 refills | Status: DC
Start: 2023-08-16 — End: 2023-11-18

## 2023-08-16 NOTE — Progress Notes (Signed)

## 2023-08-30 ENCOUNTER — Other Ambulatory Visit (INDEPENDENT_AMBULATORY_CARE_PROVIDER_SITE_OTHER): Payer: Self-pay | Admitting: Family Medicine

## 2023-08-30 DIAGNOSIS — B3731 Acute candidiasis of vulva and vagina: Secondary | ICD-10-CM

## 2023-11-18 ENCOUNTER — Encounter (HOSPITAL_COMMUNITY): Payer: Self-pay

## 2023-11-18 ENCOUNTER — Ambulatory Visit (HOSPITAL_COMMUNITY)
Admission: EM | Admit: 2023-11-18 | Discharge: 2023-11-18 | Disposition: A | Discharge status: Home / Self Care | Attending: Nurse Practitioner | Admitting: Nurse Practitioner

## 2023-11-18 DIAGNOSIS — N898 Other specified noninflammatory disorders of vagina: Secondary | ICD-10-CM | POA: Diagnosis present

## 2023-11-18 DIAGNOSIS — Z202 Contact with and (suspected) exposure to infections with a predominantly sexual mode of transmission: Secondary | ICD-10-CM | POA: Diagnosis present

## 2023-11-18 DIAGNOSIS — Z113 Encounter for screening for infections with a predominantly sexual mode of transmission: Secondary | ICD-10-CM | POA: Insufficient documentation

## 2023-11-18 LAB — HEPATITIS PANEL, ACUTE
HCV Ab: NONREACTIVE
Hep A IgM: NONREACTIVE
Hep B C IgM: NONREACTIVE
Hepatitis B Surface Ag: NONREACTIVE

## 2023-11-18 LAB — POCT URINALYSIS DIP (MANUAL ENTRY)
Bilirubin, UA: NEGATIVE
Blood, UA: NEGATIVE
Glucose, UA: NEGATIVE mg/dL
Leukocytes, UA: NEGATIVE
Nitrite, UA: NEGATIVE
Protein Ur, POC: 30 mg/dL — AB
Spec Grav, UA: 1.025 (ref 1.010–1.025)
Urobilinogen, UA: 0.2 U/dL
pH, UA: 7 (ref 5.0–8.0)

## 2023-11-18 LAB — HIV ANTIBODY (ROUTINE TESTING W REFLEX): HIV Screen 4th Generation wRfx: NONREACTIVE

## 2023-11-18 LAB — POCT URINE PREGNANCY: Preg Test, Ur: NEGATIVE

## 2023-11-18 MED ORDER — FLUCONAZOLE 150 MG PO TABS: 150.0000 mg | ORAL_TABLET | ORAL | 0 refills | Status: AC

## 2023-11-18 MED ORDER — METRONIDAZOLE 500 MG PO TABS: 500.0000 mg | ORAL_TABLET | Freq: Two times a day (BID) | ORAL | 0 refills | Status: DC

## 2023-11-18 NOTE — Discharge Instructions (Addendum)
 You were seen today for vaginal discharge, which can occur when the normal balance of bacteria in the vagina changes. This can be influenced by a number of factors, including new or multiple sexual partners, unprotected sex, douching, smoking, certain antibiotics, or pregnancy. It's also possible to develop a vaginal infection even without being sexually active.  Tests were performed today to check for bacteria, yeast, gonorrhea, chlamydia, trichomonas, HIV, syphilis and hepatitis. While results are pending, treatment has been started for the most common non-STD causes--bacterial vaginosis and yeast infection--based on your symptoms and the absence of concern for a sexually transmitted infection.  During this time, avoid douching or using vaginal sprays or deodorants. Wear cotton or cotton-lined underwear to improve airflow and reduce moisture. If you are sexually active, using condoms or dental dams can help protect you. Make sure to stay hydrated by drinking plenty of fluids.  You will only be contacted if any of your test results are positive. You can also review your results through your MyChart account.

## 2023-11-18 NOTE — ED Provider Notes (Signed)
 [facility]    CSN: [id number] Arrival date & time: 11/18/23  1320      History   Chief Complaint Chief Complaint  Patient presents with   Exposure to STD    HPI Dorothy Wallace is a 23 y.o. female.   Dorothy Wallace is a 23 y.o. female that presents with vaginal discharge for approximately one week. The discharge is described as bright white in color without odor. The patient reports slight irritation when urinating and vaginal irritation, which she rates as 3 out of 10 in severity. The patient denies itching, frequent urination, blood in urine, lower back pain, nausea, vomiting, belly pain, or fevers. Her last menstrual period was on May 15th. She is prescribed NuvaRing but has not started using it due to concerns of developing yeast infections. The patient reports being sexually active with two partners in the past three months, both female and female. She sometimes uses condoms and participates in oral sex. The patient mentions an incident in early May where her female partner's other female partner had gonorrhea. Her symptoms started approximately a week and a half ago, following sexual activity with him in late April. She was informed about the gonorrhea exposure in early May. Patient is unsure if he contracted gonorrhea as well. The patient notes a razor bump on her left vulva but denies any sores in her mouth or genital area. She is currently involved with both female and female partners.  The following portions of the patient's history were reviewed and updated as appropriate: allergies, current medications, past family history, past medical history, past social history, past surgical history, and problem list.    Past Medical History:  Diagnosis Date   Environmental allergies    History of chlamydia    History of gonorrhea    History of miscarriage     Patient Active Problem List   Diagnosis Date Noted   Miscarriage 09/15/2021   BV (bacterial vaginosis) 09/13/2021    Vaginal discharge 07/08/2021   Nipple discharge 07/08/2021   Well woman exam 07/08/2021   COVID-19 11/10/2019   Pregnancy of unknown anatomic location 08/23/2019   Chlamydia infection 10/09/2017   Seasonal allergic rhinitis due to pollen 10/08/2017   Dysthymia 01/21/2016   Oppositional defiant disorder 09/15/2013    History reviewed. No pertinent surgical history.  OB History   No obstetric history on file.      Home Medications    Prior to Admission medications   Medication Sig Start Date End Date Taking? Authorizing Provider  fluconazole  (DIFLUCAN ) 150 MG tablet Take 1 tablet (150 mg total) by mouth every 3 (three) days for 2 doses. 11/18/23 11/22/23 Yes Patrese Neal, FNP  metroNIDAZOLE  (FLAGYL ) 500 MG tablet Take 1 tablet (500 mg total) by mouth 2 (two) times daily. 11/18/23  Yes Maryruth Sol, FNP  ibuprofen (ADVIL) 600 MG tablet Take 600 mg by mouth 3 (three) times daily. 09/01/21   [provider]    Family History Family History  Problem Relation Age of Onset   Stroke Mother    Multiple sclerosis Father     Social History Social History   Tobacco Use   Smoking status: Never   Smokeless tobacco: Never  Vaping Use   Vaping status: Never Used  Substance Use Topics   Alcohol use: Yes   Drug use: Never     Allergies   Gramineae pollens and Pollen extract   Review of Systems Review of Systems  Constitutional:  Negative for fever.  HENT:  Negative for mouth sores.   Gastrointestinal:  Negative for abdominal pain, nausea and vomiting.  Genitourinary:  Positive for dysuria, genital sores (small bump to left labia due to shaving; mildly tender to touch) and vaginal discharge. Negative for hematuria and menstrual problem (LMP 11/01/23).  Musculoskeletal:  Negative for back pain.  All other systems reviewed and are negative.    Physical Exam Triage Vital Signs ED Triage Vitals [11/18/23 1440]  Encounter Vitals Group     BP 126/74     Systolic  BP Percentile      Diastolic BP Percentile      Pulse Rate 66     Resp 16     Temp 98.4 F (36.9 C)     Temp Source Oral     SpO2 98 %     Weight      Height      Head Circumference      Peak Flow      Pain Score      Pain Loc      Pain Education      Exclude from Growth Chart    No data found.  Updated Vital Signs BP 126/74 (BP Location: Left Arm)   Pulse 66   Temp 98.4 F (36.9 C) (Oral)   Resp 16   LMP 11/01/2023 (Approximate)   SpO2 98%   Visual Acuity Right Eye Distance:   Left Eye Distance:   Bilateral Distance:    Right Eye Near:   Left Eye Near:    Bilateral Near:     Physical Exam Vitals and nursing note reviewed.  Constitutional:      General: She is not in acute distress.    Appearance: Normal appearance. She is not ill-appearing, toxic-appearing or diaphoretic.  HENT:     Head: Normocephalic.     Nose: Nose normal.     Mouth/Throat:     Mouth: Mucous membranes are moist.  Eyes:     Conjunctiva/sclera: Conjunctivae normal.  Cardiovascular:     Rate and Rhythm: Normal rate.  Pulmonary:     Effort: Pulmonary effort is normal.  Abdominal:     Palpations: Abdomen is soft.  Genitourinary:    Comments: Deferred; patient performed self-swab for Aptima testing  Musculoskeletal:        General: Normal range of motion.     Cervical back: Normal range of motion and neck supple.  Skin:    General: Skin is warm and dry.  Neurological:     General: No focal deficit present.     Mental Status: She is alert and oriented to person, place, and time.  Psychiatric:        Mood and Affect: Mood normal.        Behavior: Behavior normal.      UC Treatments / Results  Labs (all labs ordered are listed, but only abnormal results are displayed) Labs Reviewed  POCT URINALYSIS DIP (MANUAL ENTRY) - Abnormal; Notable for the following components:      Result Value   Color, UA straw (*)    Ketones, POC UA small (15) (*)    Protein Ur, POC =30 (*)    All  other components within normal limits  HIV ANTIBODY (ROUTINE TESTING W REFLEX)  RPR  HEPATITIS PANEL, ACUTE  POCT URINE PREGNANCY  CERVICOVAGINAL ANCILLARY ONLY  CYTOLOGY, (ORAL, ANAL, URETHRAL) ANCILLARY ONLY    EKG   Radiology No results found.  Procedures Procedures (including critical care time)  Medications Ordered in UC Medications - No data to display  Initial Impression / Assessment and Plan / UC Course  I have reviewed the triage vital signs and the nursing notes.  Pertinent labs & imaging results that were available during my care of the patient were reviewed by me and considered in my medical decision making (see chart for details).    23 year old female presents with one week of bright white, non-odorous vaginal discharge, mild dysuria, and vaginal irritation. She reports sexual activity with both female and female partners, inconsistent condom use, and a potential exposure to gonorrhea through a partner's other contact. Symptoms began approximately one and a half weeks ago, and her last sexual encounter with the potentially exposed partner was in late April. Urinalysis is negative for UTI, and pregnancy test was negative. Given the recent exposure and symptoms, there is a high clinical suspicion for gonorrhea. A Rocephin  injection was offered for empiric treatment but patient declined. Flagyl  and Diflucan  were prescribed for possible non-STD causes of discharge. Vaginal and oral swabs for common pathogens collected and results are pending. Blood work for HIV, syphilis and hepatitis was also obtained. The patient was advised to abstain from sexual activity until test results are available, treatment is complete, and symptoms resolve. Regarding contraception, the patient was previously prescribed NuvaRing but has not started due to concerns about yeast infections. Patient advised to defer starting this until resolution of current symptoms. Contraceptive options should be discussed  with GYN if she does not desire the NuvaRing.   Today's evaluation has revealed no signs of a dangerous process. Discussed diagnosis with patient and/or guardian. Patient and/or guardian aware of their diagnosis, possible red flag symptoms to watch out for and need for close follow up. Patient and/or guardian understands verbal and written discharge instructions. Patient and/or guardian comfortable with plan and disposition.  Patient and/or guardian has a clear mental status at this time, good insight into illness (after discussion and teaching) and has clear judgment to make decisions regarding their care  Documentation was completed with the aid of voice recognition software. Transcription may contain typographical errors.  Final Clinical Impressions(s) / UC Diagnoses   Final diagnoses:  Vaginal discharge  Possible exposure to STD  Screen for STD (sexually transmitted disease)     Discharge Instructions      You were seen today for vaginal discharge, which can occur when the normal balance of bacteria in the vagina changes. This can be influenced by a number of factors, including new or multiple sexual partners, unprotected sex, douching, smoking, certain antibiotics, or pregnancy. It's also possible to develop a vaginal infection even without being sexually active.  Tests were performed today to check for bacteria, yeast, gonorrhea, chlamydia, trichomonas, HIV, syphilis and hepatitis. While results are pending, treatment has been started for the most common non-STD causes--bacterial vaginosis and yeast infection--based on your symptoms and the absence of concern for a sexually transmitted infection.  During this time, avoid douching or using vaginal sprays or deodorants. Wear cotton or cotton-lined underwear to improve airflow and reduce moisture. If you are sexually active, using condoms or dental dams can help protect you. Make sure to stay hydrated by drinking plenty of fluids.  You  will only be contacted if any of your test results are positive. You can also review your results through your MyChart account.   ED Prescriptions     Medication Sig Dispense Auth. Provider   metroNIDAZOLE  (FLAGYL ) 500 MG tablet Take 1 tablet (500  mg total) by mouth 2 (two) times daily. 14 tablet Maryruth Sol, FNP   fluconazole  (DIFLUCAN ) 150 MG tablet Take 1 tablet (150 mg total) by mouth every 3 (three) days for 2 doses. 2 tablet Maryruth Sol, FNP      PDMP not reviewed this encounter.   Maryruth Sol, [place] 11/18/23 [phone]

## 2023-11-18 NOTE — ED Triage Notes (Signed)
 Here for vaginal discharge x 1 week. Patient would like STD-testing.

## 2023-11-19 LAB — CERVICOVAGINAL ANCILLARY ONLY
Bacterial Vaginitis (gardnerella): POSITIVE — AB
Candida Glabrata: NEGATIVE
Candida Vaginitis: NEGATIVE
Chlamydia: POSITIVE — AB
Comment: NEGATIVE
Comment: NEGATIVE
Comment: NEGATIVE
Comment: NEGATIVE
Comment: NEGATIVE
Comment: NORMAL
Neisseria Gonorrhea: NEGATIVE
Trichomonas: NEGATIVE

## 2023-11-19 LAB — CYTOLOGY, (ORAL, ANAL, URETHRAL) ANCILLARY ONLY
Chlamydia: NEGATIVE
Comment: NEGATIVE
Comment: NORMAL
Neisseria Gonorrhea: NEGATIVE

## 2023-11-19 LAB — RPR: RPR Ser Ql: NONREACTIVE

## 2023-11-20 ENCOUNTER — Ambulatory Visit (HOSPITAL_COMMUNITY): Payer: Self-pay

## 2023-11-20 MED ORDER — DOXYCYCLINE HYCLATE 100 MG PO TABS: 100.0000 mg | ORAL_TABLET | Freq: Two times a day (BID) | ORAL | 0 refills | Status: AC

## 2023-12-20 ENCOUNTER — Ambulatory Visit (HOSPITAL_COMMUNITY)
Admission: EM | Admit: 2023-12-20 | Discharge: 2023-12-20 | Disposition: A | Discharge status: Home / Self Care | Attending: Family Medicine | Admitting: Family Medicine

## 2023-12-20 ENCOUNTER — Encounter (HOSPITAL_COMMUNITY): Payer: Self-pay | Admitting: *Deleted

## 2023-12-20 DIAGNOSIS — N898 Other specified noninflammatory disorders of vagina: Secondary | ICD-10-CM | POA: Insufficient documentation

## 2023-12-20 NOTE — ED Triage Notes (Signed)
 Pt states she was treated for BV and yeast but is still having vaginal discharge so would like to be rechecked.

## 2023-12-20 NOTE — ED Provider Notes (Signed)
 [facility]   [id number] 12/20/23 Arrival Time: 1252  ASSESSMENT & PLAN:  1. Vaginal discharge    Vaginal cytology pending. Will notify of any positive results. Instructed to refrain from sexual activity for at least seven days. Work note provided.  Reviewed expectations re: course of current medical issues. Questions answered. Outlined signs and symptoms indicating need for more acute intervention. Patient verbalized understanding. After Visit Summary given.   SUBJECTIVE:  Dorothy Wallace is a 23 y.o. female who states that she was treated for BV and yeast but is still having vaginal discharge and would like to be rechecked. Otherwise well. Needs work note.  Patient's last menstrual period was 12/01/2023 (exact date).   OBJECTIVE:  Vitals:   12/20/23 1418  BP: 126/80  Pulse: 68  Resp: 16  Temp: 98.4 F (36.9 C)  TempSrc: Oral  SpO2: 100%     General appearance: alert, cooperative, appears stated age and no distress GU: deferred Skin: warm and dry Psychological: alert and cooperative; normal mood and affect.  Results for orders placed or performed during the hospital encounter of 11/18/23  Cervicovaginal ancillary only   Collection Time: 11/18/23  3:07 PM  Result Value Ref Range   Neisseria Gonorrhea Negative    Chlamydia Positive (A)    Trichomonas Negative    Bacterial Vaginitis (gardnerella) Positive (A)    Candida Vaginitis Negative    Candida Glabrata Negative    Comment      Normal Reference Range Bacterial Vaginosis - Negative   Comment Normal Reference Range Candida Species - Negative    Comment Normal Reference Range Candida Galbrata - Negative    Comment Normal Reference Range Trichomonas - Negative    Comment Normal Reference Ranger Chlamydia - Negative    Comment      Normal Reference Range Neisseria Gonorrhea - Negative  POCT urine pregnancy   Collection Time: 11/18/23  3:09 PM  Result Value Ref Range   Preg Test, Ur Negative  Negative  POC urinalysis dipstick   Collection Time: 11/18/23  3:09 PM  Result Value Ref Range   Color, UA straw (A) yellow   Clarity, UA clear clear   Glucose, UA negative negative mg/dL   Bilirubin, UA negative negative   Ketones, POC UA small (15) (A) negative mg/dL   Spec Grav, UA 8.974 8.989 - 1.025   Blood, UA negative negative   pH, UA 7.0 5.0 - 8.0   Protein Ur, POC =30 (A) negative mg/dL   Urobilinogen, UA 0.2 0.2 or 1.0 E.U./dL   Nitrite, UA Negative Negative   Leukocytes, UA Negative Negative  HIV Antibody (routine testing w rflx)   Collection Time: 11/18/23  3:14 PM  Result Value Ref Range   HIV Screen 4th Generation wRfx Non Reactive Non Reactive  RPR   Collection Time: 11/18/23  3:14 PM  Result Value Ref Range   RPR Ser Ql NON REACTIVE NON REACTIVE  Hepatitis panel, acute   Collection Time: 11/18/23  3:14 PM  Result Value Ref Range   Hepatitis B Surface Ag NON REACTIVE NON REACTIVE   HCV Ab NON REACTIVE NON REACTIVE   Hep A IgM NON REACTIVE NON REACTIVE   Hep B C IgM NON REACTIVE NON REACTIVE  Cytology Ancillary Only -Oral; GC / Chlamydia   Collection Time: 11/18/23  3:45 PM  Result Value Ref Range   Neisseria Gonorrhea Negative    Chlamydia Negative    Comment Normal Reference Ranger Chlamydia - Negative  Comment      Normal Reference Range Neisseria Gonorrhea - Negative    Labs Reviewed  CERVICOVAGINAL ANCILLARY ONLY    Allergies  Allergen Reactions   Bee Pollen Other (See Comments)    Cough; itchy dry eyes; sneezing, stuffy nose Cough; itchy dry eyes; sneezing, stuffy nose   Gramineae Pollens     Cough; itchy dry eyes; sneezing, stuffy nose   Pollen Extract Other (See Comments)    Cough; itchy dry eyes; sneezing, stuffy nose Cough; itchy dry eyes; sneezing, stuffy nose     Past Medical History:  Diagnosis Date   Environmental allergies    History of chlamydia    History of gonorrhea    History of miscarriage    Family History   Problem Relation Age of Onset   Stroke Mother    Multiple sclerosis Father    Social History   Socioeconomic History   Marital status: Single    Spouse name: Not on file   Number of children: Not on file   Years of education: Not on file   Highest education level: Not on file  Occupational History   Not on file  Tobacco Use   Smoking status: Never   Smokeless tobacco: Never  Vaping Use   Vaping status: Never Used  Substance and Sexual Activity   Alcohol use: Yes   Drug use: Never   Sexual activity: Yes    Birth control/protection: Other-see comments    Comment: ring  Other Topics Concern   Not on file  Social History Narrative   Not on file   Social Drivers of Health   Financial Resource Strain: Not on file  Food Insecurity: No Food Insecurity (10/17/2023)   Received from [facility]   Hunger Vital Sign    Within the past 12 months, you worried that your food would run out before you got the money to buy more.: Never true    Within the past 12 months, the food you bought just didn't last and you didn't have money to get more.: Never true  Transportation Needs: No Transportation Needs (10/17/2023)   Received from [facility] - Transportation    Lack of Transportation (Medical): No    Lack of Transportation (Non-Medical): No  Physical Activity: Not on file  Stress: Not on file  Social Connections: Not on file  Intimate Partner Violence: Not At Risk (05/26/2022)   Received from [facility]   Humiliation, Afraid, Rape, and Kick questionnaire    Within the last year, have you been afraid of your partner or ex-partner?: No    Within the last year, have you been humiliated or emotionally abused in other ways by your partner or ex-partner?: No    Within the last year, have you been kicked, hit, slapped, or otherwise physically hurt by your partner or ex-partner?: No    Within the last year, have you been raped or forced to have any kind of sexual  activity by your partner or ex-partner?: No           Rolinda Rogue, MD 12/20/23 1458

## 2023-12-24 ENCOUNTER — Ambulatory Visit (HOSPITAL_COMMUNITY): Payer: Self-pay

## 2023-12-24 LAB — CERVICOVAGINAL ANCILLARY ONLY
Bacterial Vaginitis (gardnerella): POSITIVE — AB
Candida Glabrata: NEGATIVE
Candida Vaginitis: NEGATIVE
Chlamydia: NEGATIVE
Comment: NEGATIVE
Comment: NEGATIVE
Comment: NEGATIVE
Comment: NEGATIVE
Comment: NEGATIVE
Comment: NORMAL
Neisseria Gonorrhea: NEGATIVE
Trichomonas: NEGATIVE

## 2023-12-24 MED ORDER — METRONIDAZOLE 500 MG PO TABS: 500.0000 mg | ORAL_TABLET | Freq: Two times a day (BID) | ORAL | 0 refills | Status: AC

## 2024-01-25 ENCOUNTER — Telehealth: Admitting: Nurse Practitioner

## 2024-01-25 DIAGNOSIS — T3695XA Adverse effect of unspecified systemic antibiotic, initial encounter: Secondary | ICD-10-CM

## 2024-01-25 DIAGNOSIS — B379 Candidiasis, unspecified: Secondary | ICD-10-CM

## 2024-01-25 MED ORDER — FLUCONAZOLE 150 MG PO TABS: 150.0000 mg | ORAL_TABLET | Freq: Once | ORAL | 0 refills | Status: AC

## 2024-01-25 NOTE — Progress Notes (Signed)

## 2024-01-29 ENCOUNTER — Other Ambulatory Visit: Payer: Self-pay | Admitting: Nurse Practitioner

## 2024-01-29 DIAGNOSIS — B379 Candidiasis, unspecified: Secondary | ICD-10-CM

## 2024-01-29 MED ORDER — FLUCONAZOLE 150 MG PO TABS: 150.0000 mg | ORAL_TABLET | Freq: Once | ORAL | 0 refills | Status: AC

## 2024-02-02 ENCOUNTER — Other Ambulatory Visit: Payer: Self-pay

## 2024-02-02 ENCOUNTER — Ambulatory Visit (HOSPITAL_COMMUNITY)
Admission: EM | Admit: 2024-02-02 | Discharge: 2024-02-02 | Disposition: A | Discharge status: Home / Self Care | Attending: Family Medicine | Admitting: Family Medicine

## 2024-02-02 ENCOUNTER — Encounter (HOSPITAL_COMMUNITY): Payer: Self-pay | Admitting: *Deleted

## 2024-02-02 DIAGNOSIS — Z3A01 Less than 8 weeks gestation of pregnancy: Secondary | ICD-10-CM | POA: Diagnosis not present

## 2024-02-02 DIAGNOSIS — N898 Other specified noninflammatory disorders of vagina: Secondary | ICD-10-CM

## 2024-02-02 DIAGNOSIS — O26891 Other specified pregnancy related conditions, first trimester: Secondary | ICD-10-CM | POA: Insufficient documentation

## 2024-02-02 DIAGNOSIS — R102 Pelvic and perineal pain: Secondary | ICD-10-CM | POA: Diagnosis not present

## 2024-02-02 DIAGNOSIS — Z3201 Encounter for pregnancy test, result positive: Secondary | ICD-10-CM | POA: Diagnosis not present

## 2024-02-02 LAB — POCT URINALYSIS DIP (MANUAL ENTRY)
Bilirubin, UA: NEGATIVE
Blood, UA: NEGATIVE
Glucose, UA: NEGATIVE mg/dL
Ketones, POC UA: NEGATIVE mg/dL
Leukocytes, UA: NEGATIVE
Nitrite, UA: NEGATIVE
Spec Grav, UA: 1.025 (ref 1.010–1.025)
Urobilinogen, UA: 0.2 U/dL
pH, UA: 7 (ref 5.0–8.0)

## 2024-02-02 LAB — POCT URINE PREGNANCY: Preg Test, Ur: POSITIVE — AB

## 2024-02-02 NOTE — Discharge Instructions (Signed)
 The pregnancy test was positive  Staff will notify you if there is anything positive on the swab (or on the blood work if that has been taken at this visit). It can take 2-3 days for the tests to result, depending on the day of the week your test was taken. You will only be notified if there are any positives on the testing; test results will also go to your MyChart if you are signed up for MyChart.   Tylenol/acetaminophen over-the-counter is first-line to be taken if you need something for pain or fever while you are pregnant.  Call your OB/GYN on Monday when they are open.

## 2024-02-02 NOTE — ED Provider Notes (Signed)
 [facility]    CSN: [id number] Arrival date & time: 02/02/24  1214      History   Chief Complaint Chief Complaint  Patient presents with   Possible Pregnancy   vaginal cramping    HPI Dorothy Wallace is a 23 y.o. female.    Possible Pregnancy   Here for missed menstrual cycle.  Last normal cycle was July 15.  It was 2 to come on August 13 and has not.  She did 2 home pregnancy test and they are positive.  For several weeks she has had some pelvic pressure that is been fairly constant and somewhat irritation in her vaginal area.  No dysuria and no fever.  She was treated empirically for BV and the vaginal discharge she was having is continuing.  She is allergic to no medications Past Medical History:  Diagnosis Date   Environmental allergies    History of chlamydia    History of gonorrhea    History of miscarriage     Patient Active Problem List   Diagnosis Date Noted   Miscarriage 09/15/2021   BV (bacterial vaginosis) 09/13/2021   Vaginal discharge 07/08/2021   Nipple discharge 07/08/2021   Well woman exam 07/08/2021   COVID-19 11/10/2019   Pregnancy of unknown anatomic location 08/23/2019   Chlamydia infection 10/09/2017   Seasonal allergic rhinitis due to pollen 10/08/2017   Dysthymia 01/21/2016   Oppositional defiant disorder 09/15/2013    History reviewed. No pertinent surgical history.  OB History   No obstetric history on file.      Home Medications    Prior to Admission medications   Not on File    Family History Family History  Problem Relation Age of Onset   Stroke Mother    Multiple sclerosis Father     Social History Social History   Tobacco Use   Smoking status: Never   Smokeless tobacco: Never  Vaping Use   Vaping status: Never Used  Substance Use Topics   Alcohol use: Yes   Drug use: Never     Allergies   Bee pollen, Gramineae pollens, and Pollen extract   Review of Systems Review of  Systems   Physical Exam Triage Vital Signs ED Triage Vitals  Encounter Vitals Group     BP 02/02/24 1323 113/74     Girls Systolic BP Percentile --      Girls Diastolic BP Percentile --      Boys Systolic BP Percentile --      Boys Diastolic BP Percentile --      Pulse Rate 02/02/24 1323 68     Resp 02/02/24 1323 18     Temp 02/02/24 1323 99.4 F (37.4 C)     Temp src --      SpO2 02/02/24 1323 99 %     Weight --      Height --      Head Circumference --      Peak Flow --      Pain Score 02/02/24 1318 4     Pain Loc --      Pain Education --      Exclude from Growth Chart --    No data found.  Updated Vital Signs BP 113/74   Pulse 68   Temp 99.4 F (37.4 C)   Resp 18   LMP 01/01/2024   SpO2 99%   Visual Acuity Right Eye Distance:   Left Eye Distance:   Bilateral Distance:  Right Eye Near:   Left Eye Near:    Bilateral Near:     Physical Exam Vitals reviewed.  Constitutional:      General: She is not in acute distress.    Appearance: She is not toxic-appearing.  Skin:    Coloration: Skin is not pale.  Neurological:     Mental Status: She is alert and oriented to person, place, and time.  Psychiatric:        Behavior: Behavior normal.      UC Treatments / Results  Labs (all labs ordered are listed, but only abnormal results are displayed) Labs Reviewed  POCT URINE PREGNANCY - Abnormal; Notable for the following components:      Result Value   Preg Test, Ur Positive (*)    All other components within normal limits  POCT URINALYSIS DIP (MANUAL ENTRY) - Abnormal; Notable for the following components:   Clarity, UA turbid (*)    Protein Ur, POC trace (*)    All other components within normal limits  CERVICOVAGINAL ANCILLARY ONLY    EKG   Radiology No results found.  Procedures Procedures (including critical care time)  Medications Ordered in UC Medications - No data to display  Initial Impression / Assessment and Plan / UC Course   I have reviewed the triage vital signs and the nursing notes.  Pertinent labs & imaging results that were available during my care of the patient were reviewed by me and considered in my medical decision making (see chart for details).     UPT is positive here also.  Urinalysis is clear. Vaginal self swab is done, and we will notify of any positives on that and treat per protocol. She will contact her OB/GYN about her positive pregnancy test.  If she begins having pelvic cramping or bleeding she will go to the hospital for further evaluation  Final Clinical Impressions(s) / UC Diagnoses   Final diagnoses:  Less than [redacted] weeks gestation of pregnancy  Vaginal discharge  Pelvic pain     Discharge Instructions      The pregnancy test was positive  Staff will notify you if there is anything positive on the swab (or on the blood work if that has been taken at this visit). It can take 2-3 days for the tests to result, depending on the day of the week your test was taken. You will only be notified if there are any positives on the testing; test results will also go to your MyChart if you are signed up for MyChart.   Tylenol/acetaminophen over-the-counter is first-line to be taken if you need something for pain or fever while you are pregnant.  Call your OB/GYN on Monday when they are open.    ED Prescriptions   None    PDMP not reviewed this encounter.   Vonna Sharlet POUR, MD 02/02/24 [phone]

## 2024-02-02 NOTE — ED Triage Notes (Signed)
 PT also reports vaginal cramping .

## 2024-02-02 NOTE — ED Triage Notes (Signed)
 PT reports she had 2 positive pregnancy tests today. PT also wants STD testing.

## 2024-02-04 LAB — CERVICOVAGINAL ANCILLARY ONLY
Bacterial Vaginitis (gardnerella): POSITIVE — AB
Candida Glabrata: NEGATIVE
Candida Vaginitis: NEGATIVE
Chlamydia: NEGATIVE
Comment: NEGATIVE
Comment: NEGATIVE
Comment: NEGATIVE
Comment: NEGATIVE
Comment: NEGATIVE
Comment: NORMAL
Neisseria Gonorrhea: NEGATIVE
Trichomonas: NEGATIVE

## 2024-02-05 ENCOUNTER — Ambulatory Visit: Payer: Self-pay

## 2024-02-05 MED ORDER — METRONIDAZOLE 0.75 % VA GEL: 1.0000 | Freq: Every day | VAGINAL | 0 refills | Status: AC

## 2024-02-05 MED ORDER — CLOTRIMAZOLE 3 2 % VA CREA: 1.0000 | TOPICAL_CREAM | Freq: Every day | VAGINAL | 0 refills | Status: AC

## 2024-02-05 NOTE — Telephone Encounter (Signed)
 See pt message

## 2024-02-19 ENCOUNTER — Inpatient Hospital Stay (HOSPITAL_BASED_OUTPATIENT_CLINIC_OR_DEPARTMENT_OTHER)
Admission: EM | Admit: 2024-02-19 | Discharge: 2024-02-20 | Disposition: A | Discharge status: Home / Self Care | Attending: Emergency Medicine | Admitting: Emergency Medicine

## 2024-02-19 ENCOUNTER — Other Ambulatory Visit: Payer: Self-pay

## 2024-02-19 ENCOUNTER — Encounter (HOSPITAL_BASED_OUTPATIENT_CLINIC_OR_DEPARTMENT_OTHER): Payer: Self-pay | Admitting: Emergency Medicine

## 2024-02-19 DIAGNOSIS — R1030 Lower abdominal pain, unspecified: Secondary | ICD-10-CM | POA: Diagnosis not present

## 2024-02-19 DIAGNOSIS — O209 Hemorrhage in early pregnancy, unspecified: Secondary | ICD-10-CM | POA: Diagnosis present

## 2024-02-19 DIAGNOSIS — Z3491 Encounter for supervision of normal pregnancy, unspecified, first trimester: Secondary | ICD-10-CM

## 2024-02-19 DIAGNOSIS — O26891 Other specified pregnancy related conditions, first trimester: Secondary | ICD-10-CM | POA: Insufficient documentation

## 2024-02-19 DIAGNOSIS — Z3A01 Less than 8 weeks gestation of pregnancy: Secondary | ICD-10-CM | POA: Insufficient documentation

## 2024-02-19 LAB — URINALYSIS, ROUTINE W REFLEX MICROSCOPIC
Bilirubin Urine: NEGATIVE
Glucose, UA: NEGATIVE mg/dL
Hgb urine dipstick: NEGATIVE
Ketones, ur: 80 mg/dL — AB
Leukocytes,Ua: NEGATIVE
Nitrite: NEGATIVE
Protein, ur: 30 mg/dL — AB
Specific Gravity, Urine: >=1.03 (ref 1.005–1.030)
pH: 5.5 (ref 5.0–8.0)

## 2024-02-19 LAB — CBC
HCT: 35 % — ABNORMAL LOW (ref 36.0–46.0)
Hemoglobin: 11.5 g/dL — ABNORMAL LOW (ref 12.0–15.0)
MCH: 25.3 pg — ABNORMAL LOW (ref 26.0–34.0)
MCHC: 32.9 g/dL (ref 30.0–36.0)
MCV: 77.1 fL — ABNORMAL LOW (ref 80.0–100.0)
Platelets: 217 K/uL (ref 150–400)
RBC: 4.54 MIL/uL (ref 3.87–5.11)
RDW: 17.9 % — ABNORMAL HIGH (ref 11.5–15.5)
WBC: 5.5 K/uL (ref 4.0–10.5)
nRBC: 0 % (ref 0.0–0.2)

## 2024-02-19 LAB — COMPREHENSIVE METABOLIC PANEL WITH GFR
ALT: 11 U/L (ref 0–44)
AST: 16 U/L (ref 15–41)
Albumin: 4.6 g/dL (ref 3.5–5.0)
Alkaline Phosphatase: 53 U/L (ref 38–126)
Anion gap: 13 (ref 5–15)
BUN: 9 mg/dL (ref 6–20)
CO2: 21 mmol/L — ABNORMAL LOW (ref 22–32)
Calcium: 9.7 mg/dL (ref 8.9–10.3)
Chloride: 103 mmol/L (ref 98–111)
Creatinine, Ser: 0.6 mg/dL (ref 0.44–1.00)
GFR, Estimated: >60 mL/min (ref >60)
Glucose, Bld: 115 mg/dL — ABNORMAL HIGH (ref 70–99)
Potassium: 3.7 mmol/L (ref 3.5–5.1)
Sodium: 136 mmol/L (ref 135–145)
Total Bilirubin: 0.4 mg/dL (ref 0.0–1.2)
Total Protein: 8.3 g/dL — ABNORMAL HIGH (ref 6.5–8.1)

## 2024-02-19 LAB — URINALYSIS, MICROSCOPIC (REFLEX): Bacteria, UA: NONE SEEN

## 2024-02-19 LAB — LIPASE, BLOOD: Lipase: 25 U/L (ref 11–51)

## 2024-02-19 LAB — HCG, QUANTITATIVE, PREGNANCY: hCG, Beta Chain, Quant, S: 105956 m[IU]/mL — ABNORMAL HIGH (ref <5)

## 2024-02-19 LAB — PREGNANCY, URINE: Preg Test, Ur: POSITIVE — AB

## 2024-02-19 NOTE — Discharge Instructions (Addendum)
 Start prenatal care with a provider of your choice  Prenatal Care Providers           [facility] @ [facility]  [address] [phone]  Center for [facility] @ [facility]   [address]  [phone]  [facility] @ [facility]       [address] [phone]            Center for [facility] @ [place]     [phone]          [facility] for [facility] @ [facility]   [address] [phone]  Center for [facility] @ [facility]  [address] [phone]     Center for [facility] @ [address] [place])  [address]   [phone]     [facility]  Phone: [phone]  [facility]  Phone: [phone]  [facility] Phone: [phone]  [facility] Phone: [phone]  [facility] Phone: [phone]  [facility] Phone: [phone]  [facility]  Phone: [phone]

## 2024-02-19 NOTE — ED Triage Notes (Signed)
 Pt c/o abd cramping x 2 days. Denies vaginal bleeding.   Pt reports she is appx [redacted] weeks pregnant. Took tylenol appx 1430.

## 2024-02-19 NOTE — MAU Note (Signed)
 Dorothy Wallace is a 23 y.o. at [redacted]w[redacted]d here in MAU reporting abdominal cramping for a week. No bleeding. She came over from [facility] for u/s.   LMP: 01/01/24 Onset of complaint: a week Pain score: 7 Vitals:   02/19/24 2225 02/19/24 2228  BP:  131/65  Pulse: 93   Resp: 17   Temp: 98.4 F (36.9 C)   SpO2: 100%      FHT: na  Lab orders placed from triage: none

## 2024-02-19 NOTE — ED Provider Notes (Signed)
  [facility] Provider Note   CSN: [id number] Arrival date & time: 02/19/24  1911     Patient presents with: Abdominal Pain   Dorothy Wallace is a 23 y.o. female.  {Add pertinent medical, surgical, social history, OB history to HPI:32947} HPI     G3P  Past Medical History:  Diagnosis Date   Environmental allergies    History of chlamydia    History of gonorrhea    History of miscarriage     Prior to Admission medications   Not on File    Allergies: Bee pollen, Gramineae pollens, and Pollen extract    Review of Systems  Updated Vital Signs BP 113/81   Pulse 94   Temp 98.2 F (36.8 C) (Oral)   Resp 17   Ht 5' 9 (1.753 m)   Wt 101.6 kg   LMP 01/01/2024   SpO2 100%   BMI 33.08 kg/m   Physical Exam  (all labs ordered are listed, but only abnormal results are displayed) Labs Reviewed  URINALYSIS, ROUTINE W REFLEX MICROSCOPIC - Abnormal; Notable for the following components:      Result Value   APPearance HAZY (*)    Ketones, ur 80 (*)    Protein, ur 30 (*)    All other components within normal limits  PREGNANCY, URINE - Abnormal; Notable for the following components:   Preg Test, Ur POSITIVE (*)    All other components within normal limits  URINALYSIS, MICROSCOPIC (REFLEX)  LIPASE, BLOOD  COMPREHENSIVE METABOLIC PANEL WITH GFR  CBC  HCG, QUANTITATIVE, PREGNANCY    EKG: None  Radiology: No results found.  {Document cardiac monitor, telemetry assessment procedure when appropriate:32947} Procedures   Medications Ordered in the ED - No data to display    {Click here for ABCD2, HEART and other calculators REFRESH Note before signing:1}                              Medical Decision Making Amount and/or Complexity of Data Reviewed Labs: ordered. Radiology: ordered.   ***  {Document critical care time when appropriate  Document review of labs and clinical decision tools ie CHADS2VASC2, etc   Document your independent review of radiology images and any outside records  Document your discussion with family members, caretakers and with consultants  Document social determinants of health affecting pt's care  Document your decision making why or why not admission, treatments were needed:32947:::1}   Final diagnoses:  None    ED Discharge Orders     None

## 2024-02-20 ENCOUNTER — Inpatient Hospital Stay (HOSPITAL_COMMUNITY)

## 2024-02-20 DIAGNOSIS — R1030 Lower abdominal pain, unspecified: Secondary | ICD-10-CM

## 2024-02-20 DIAGNOSIS — Z3A01 Less than 8 weeks gestation of pregnancy: Secondary | ICD-10-CM

## 2024-02-20 DIAGNOSIS — O26891 Other specified pregnancy related conditions, first trimester: Secondary | ICD-10-CM

## 2024-02-20 MED ORDER — VITAFOL ULTRA 29-0.6-0.4-200 MG PO CAPS: 1.0000 | ORAL_CAPSULE | Freq: Every day | ORAL | 12 refills | Status: DC

## 2024-02-20 NOTE — MAU Provider Note (Signed)
 History     CSN: [id number]  Arrival date and time: 02/19/24 1911   None     Chief Complaint  Patient presents with   Abdominal Pain   HPI 23 yo G3P0020 at [redacted]w[redacted]d by LMP presenting for evaluation of a 1-week history of abdominal cramping. Patient denies any vaginal bleeding. She has taken tylenol to ease her pain. Patient has not started prenatal care  OB History     Gravida  3   Para      Term      Preterm      AB  2   Living         SAB  2   IAB      Ectopic      Multiple      Live Births              Past Medical History:  Diagnosis Date   Environmental allergies    History of chlamydia    History of gonorrhea    History of miscarriage     No past surgical history on file.  Family History  Problem Relation Age of Onset   Stroke Mother    Multiple sclerosis Father     Social History   Tobacco Use   Smoking status: Never   Smokeless tobacco: Never  Vaping Use   Vaping status: Never Used  Substance Use Topics   Alcohol use: Yes   Drug use: Never    Allergies:  Allergies  Allergen Reactions   Bee Pollen Other (See Comments)    Cough; itchy dry eyes; sneezing, stuffy nose Cough; itchy dry eyes; sneezing, stuffy nose   Gramineae Pollens     Cough; itchy dry eyes; sneezing, stuffy nose   Pollen Extract Other (See Comments)    Cough; itchy dry eyes; sneezing, stuffy nose Cough; itchy dry eyes; sneezing, stuffy nose     No medications prior to admission.    Review of Systems See pertinent in HPI. All other systems reviewed and non contributory Physical Exam   Blood pressure 131/65, pulse 93, temperature 98.4 F (36.9 C), resp. rate 17, height 5' 9 (1.753 m), weight 103 kg, last menstrual period 01/01/2024, SpO2 100%.  Physical Exam GENERAL: Well-developed, well-nourished female in no acute distress.  ABDOMEN: Soft, nontender, nondistended. No organomegaly. PELVIC: Not performed EXTREMITIES: No cyanosis, clubbing, or edema,  2+ distal pulses.   MAU Course  Procedures US  OB LESS THAN 14 WEEKS WITH OB TRANSVAGINAL Result Date: 02/20/2024 CLINICAL DATA:  Abdominal pain. EXAM: OBSTETRIC <14 WK US  AND TRANSVAGINAL OB US  TECHNIQUE: Both transabdominal and transvaginal ultrasound examinations were performed for complete evaluation of the gestation as well as the maternal uterus, adnexal regions, and pelvic cul-de-sac. Transvaginal technique was performed to assess early pregnancy. COMPARISON:  None Available. FINDINGS: Intrauterine gestational sac: Single Yolk sac:  Visualized. Embryo:  Visualized. Cardiac Activity: Visualized. Heart Rate: 158 bpm CRL:  12 mm   7 w   3 d                  US  EDC: 10/04/2024 Subchorionic hemorrhage:  None visualized. Maternal uterus/adnexae: Bilateral ovaries are visualized and appear within normal limits. No pelvic free fluid. IMPRESSION: 1. Single live intrauterine gestation measuring 7 weeks 3 days. Electronically Signed   By: Greig Pique M.D.   On: 02/20/2024 00:42     Assessment and Plan  23 yo G3P0020 at [redacted]w[redacted]d by LMP c/w us  - reassurance provided - reviewed  ultrasound results with patient - Start prenatal vitamins- rx provided - Patient encouraged to start prenatal care   Alexius Hangartner 02/20/2024, 12:45 AM

## 2024-02-21 ENCOUNTER — Emergency Department (HOSPITAL_BASED_OUTPATIENT_CLINIC_OR_DEPARTMENT_OTHER)
Admission: EM | Admit: 2024-02-21 | Discharge: 2024-02-21 | Disposition: A | Discharge status: Home / Self Care | Attending: Emergency Medicine | Admitting: Emergency Medicine

## 2024-02-21 ENCOUNTER — Other Ambulatory Visit: Payer: Self-pay

## 2024-02-21 ENCOUNTER — Encounter (HOSPITAL_BASED_OUTPATIENT_CLINIC_OR_DEPARTMENT_OTHER): Payer: Self-pay

## 2024-02-21 DIAGNOSIS — O219 Vomiting of pregnancy, unspecified: Secondary | ICD-10-CM | POA: Insufficient documentation

## 2024-02-21 DIAGNOSIS — R809 Proteinuria, unspecified: Secondary | ICD-10-CM

## 2024-02-21 DIAGNOSIS — O1211 Gestational proteinuria, first trimester: Secondary | ICD-10-CM | POA: Insufficient documentation

## 2024-02-21 DIAGNOSIS — Z3A01 Less than 8 weeks gestation of pregnancy: Secondary | ICD-10-CM | POA: Diagnosis not present

## 2024-02-21 DIAGNOSIS — R112 Nausea with vomiting, unspecified: Secondary | ICD-10-CM

## 2024-02-21 LAB — URINALYSIS, ROUTINE W REFLEX MICROSCOPIC
Glucose, UA: NEGATIVE mg/dL
Hgb urine dipstick: NEGATIVE
Ketones, ur: >=80 mg/dL — AB
Leukocytes,Ua: NEGATIVE
Nitrite: NEGATIVE
Protein, ur: 30 mg/dL — AB
Specific Gravity, Urine: >=1.03 (ref 1.005–1.030)
pH: 6 (ref 5.0–8.0)

## 2024-02-21 LAB — COMPREHENSIVE METABOLIC PANEL WITH GFR
ALT: 14 U/L (ref 0–44)
AST: 18 U/L (ref 15–41)
Albumin: 4.7 g/dL (ref 3.5–5.0)
Alkaline Phosphatase: 55 U/L (ref 38–126)
Anion gap: 14 (ref 5–15)
BUN: 9 mg/dL (ref 6–20)
CO2: 21 mmol/L — ABNORMAL LOW (ref 22–32)
Calcium: 9.6 mg/dL (ref 8.9–10.3)
Chloride: 102 mmol/L (ref 98–111)
Creatinine, Ser: 0.64 mg/dL (ref 0.44–1.00)
GFR, Estimated: >60 mL/min (ref >60)
Glucose, Bld: 75 mg/dL (ref 70–99)
Potassium: 3.7 mmol/L (ref 3.5–5.1)
Sodium: 137 mmol/L (ref 135–145)
Total Bilirubin: 0.5 mg/dL (ref 0.0–1.2)
Total Protein: 8.4 g/dL — ABNORMAL HIGH (ref 6.5–8.1)

## 2024-02-21 LAB — CBC WITH DIFFERENTIAL/PLATELET
Abs Immature Granulocytes: 0.01 K/uL (ref 0.00–0.07)
Basophils Absolute: 0 K/uL (ref 0.0–0.1)
Basophils Relative: 0 %
Eosinophils Absolute: 0 K/uL (ref 0.0–0.5)
Eosinophils Relative: 0 %
HCT: 34.5 % — ABNORMAL LOW (ref 36.0–46.0)
Hemoglobin: 11.3 g/dL — ABNORMAL LOW (ref 12.0–15.0)
Immature Granulocytes: 0 %
Lymphocytes Relative: 19 %
Lymphs Abs: 0.9 K/uL (ref 0.7–4.0)
MCH: 25.4 pg — ABNORMAL LOW (ref 26.0–34.0)
MCHC: 32.8 g/dL (ref 30.0–36.0)
MCV: 77.5 fL — ABNORMAL LOW (ref 80.0–100.0)
Monocytes Absolute: 0.4 K/uL (ref 0.1–1.0)
Monocytes Relative: 9 %
Neutro Abs: 3.3 K/uL (ref 1.7–7.7)
Neutrophils Relative %: 72 %
Platelets: 198 K/uL (ref 150–400)
RBC: 4.45 MIL/uL (ref 3.87–5.11)
RDW: 18.4 % — ABNORMAL HIGH (ref 11.5–15.5)
WBC: 4.7 K/uL (ref 4.0–10.5)
nRBC: 0 % (ref 0.0–0.2)

## 2024-02-21 LAB — LIPASE, BLOOD: Lipase: 38 U/L (ref 11–51)

## 2024-02-21 LAB — URINALYSIS, MICROSCOPIC (REFLEX)

## 2024-02-21 MED ORDER — SODIUM CHLORIDE 0.9 % IV BOLUS
1000.0000 mL | Freq: Once | INTRAVENOUS | Status: AC
  Administered 2024-02-21: 1000 mL via INTRAVENOUS

## 2024-02-21 MED ORDER — METOCLOPRAMIDE HCL 5 MG/ML IJ SOLN
10.0000 mg | Freq: Once | INTRAMUSCULAR | Status: AC
  Administered 2024-02-21: 10 mg via INTRAVENOUS
  Filled 2024-02-21: qty 2

## 2024-02-21 NOTE — ED Notes (Signed)
 Pt. Reports this is the second time she has been seen for vomiting and has not had a BM for 7 days but has not been able to eat a complete diet since she was here as well.  Pt. States she is [redacted] weeks pregnant.

## 2024-02-21 NOTE — ED Triage Notes (Signed)
 Pt states she was recently seen here on the 2nd. Has been unable to keep down anything since then. Pt has been vomiting, this morning saw some blood in the vomit. Pt also has bloating and constipation. Pt says no bm for 2 weeks.  Pt 7 weeks and 3 days pregnant.

## 2024-02-21 NOTE — ED Provider Notes (Signed)
 [place] [facility] Provider Note   CSN: [id number] Arrival date & time: 02/21/24  1234     Patient presents with: Emesis   Dorothy Wallace is a 23 y.o. female with a past medical history significant for ODD who presents to the ED due to nausea and vomitiing.  Patient states she is unable to tolerate p.o.  Currently [redacted]w[redacted]d gestation.  Seen 2 days ago in the ED for abdominal cramping where she had an unremarkable ultrasound.  Patient states she has not had a bowel movement in 2 weeks.  Denies any abdominal pain.  No vaginal bleeding.  Denies fluid from the vagina.  No fever or chills.  No dysuria. Notes when she threw up earlier, she had a few specks on blood in emesis.   History obtained from patient and past medical records. No interpreter used during encounter.       Prior to Admission medications   Medication Sig Start Date End Date Taking? Authorizing Provider  Prenat-Fe Poly-Methfol-FA-DHA (VITAFOL  ULTRA) 29-0.6-0.4-200 MG CAPS Take 1 capsule by mouth daily. 02/20/24   Constant, Peggy, MD    Allergies: Bee pollen, Gramineae pollens, and Pollen extract    Review of Systems  Constitutional:  Negative for chills and fever.  Respiratory:  Negative for shortness of breath.   Cardiovascular:  Negative for chest pain.  Gastrointestinal:  Positive for nausea and vomiting. Negative for abdominal pain and diarrhea.  Genitourinary:  Negative for dysuria.    Updated Vital Signs BP 118/69   Pulse 87   Temp 97.6 F (36.4 C) (Oral)   Resp 17   Ht 5' 9 (1.753 m)   Wt 99.8 kg   LMP 01/01/2024   SpO2 100%   BMI 32.49 kg/m   Physical Exam Vitals and nursing note reviewed.  Constitutional:      General: She is not in acute distress.    Appearance: She is not ill-appearing.  HENT:     Head: Normocephalic.  Eyes:     Pupils: Pupils are equal, round, and reactive to light.  Cardiovascular:     Rate and Rhythm: Normal rate and regular rhythm.      Pulses: Normal pulses.     Heart sounds: Normal heart sounds. No murmur heard.    No friction rub. No gallop.  Pulmonary:     Effort: Pulmonary effort is normal.     Breath sounds: Normal breath sounds.  Abdominal:     General: Abdomen is flat. There is no distension.     Palpations: Abdomen is soft.     Tenderness: There is no abdominal tenderness. There is no guarding or rebound.     Comments: Abdomen soft, nondistended, nontender to palpation in all quadrants without guarding or peritoneal signs. No rebound.   Musculoskeletal:        General: Normal range of motion.     Cervical back: Neck supple.  Skin:    General: Skin is warm and dry.  Neurological:     General: No focal deficit present.     Mental Status: She is alert.  Psychiatric:        Mood and Affect: Mood normal.        Behavior: Behavior normal.     (all labs ordered are listed, but only abnormal results are displayed) Labs Reviewed  COMPREHENSIVE METABOLIC PANEL WITH GFR - Abnormal; Notable for the following components:      Result Value   CO2 21 (*)  Total Protein 8.4 (*)    All other components within normal limits  CBC WITH DIFFERENTIAL/PLATELET - Abnormal; Notable for the following components:   Hemoglobin 11.3 (*)    HCT 34.5 (*)    MCV 77.5 (*)    MCH 25.4 (*)    RDW 18.4 (*)    All other components within normal limits  URINALYSIS, ROUTINE W REFLEX MICROSCOPIC - Abnormal; Notable for the following components:   APPearance HAZY (*)    Bilirubin Urine SMALL (*)    Ketones, ur >=80 (*)    Protein, ur 30 (*)    All other components within normal limits  URINALYSIS, MICROSCOPIC (REFLEX) - Abnormal; Notable for the following components:   Bacteria, UA RARE (*)    All other components within normal limits  LIPASE, BLOOD    EKG: None  Radiology: US  OB LESS THAN 14 WEEKS WITH OB TRANSVAGINAL Result Date: 02/20/2024 CLINICAL DATA:  Abdominal pain. EXAM: OBSTETRIC <14 WK US  AND TRANSVAGINAL OB US   TECHNIQUE: Both transabdominal and transvaginal ultrasound examinations were performed for complete evaluation of the gestation as well as the maternal uterus, adnexal regions, and pelvic cul-de-sac. Transvaginal technique was performed to assess early pregnancy. COMPARISON:  None Available. FINDINGS: Intrauterine gestational sac: Single Yolk sac:  Visualized. Embryo:  Visualized. Cardiac Activity: Visualized. Heart Rate: 158 bpm CRL:  12 mm   7 w   3 d                  US  EDC: 10/04/2024 Subchorionic hemorrhage:  None visualized. Maternal uterus/adnexae: Bilateral ovaries are visualized and appear within normal limits. No pelvic free fluid. IMPRESSION: 1. Single live intrauterine gestation measuring 7 weeks 3 days. Electronically Signed   By: Greig Pique M.D.   On: 02/20/2024 00:42     Procedures   Medications Ordered in the ED  metoCLOPramide  (REGLAN ) injection 10 mg (10 mg Intravenous Given 02/21/24 1337)  sodium chloride  0.9 % bolus 1,000 mL (0 mLs Intravenous Stopped 02/21/24 1517)    Clinical Course as of 02/21/24 1557  Thu Feb 21, 2024  1444 Reassessed patient at bedside.  Patient able to tolerate p.o. without difficulty.  Eating crackers.  Patient calling OB right now to send prescription in for nausea. [CA]    Clinical Course User Index [CA] Lorelle Aleck BROCKS, PA-C                                 Medical Decision Making Amount and/or Complexity of Data Reviewed External Data Reviewed: notes.    Details: MAU note Labs: ordered. Decision-making details documented in ED Course.  Risk Prescription drug management.   This patient presents to the ED for concern of N/V, this involves an extensive number of treatment options, and is a complaint that carries with it a high risk of complications and morbidity.  The differential diagnosis includes AKI, electrolyte abnormalities, pancreatitis, acute cholecystitis, etc  23 year old female presents to the ED due to nausea and vomiting for  the past few days.  Currently [redacted]w[redacted]d gestation.  Had an unremarkable ultrasound 2 days ago.  No abdominal pain, vaginal bleeding, or fluid from the vagina.  Upon arrival patient afebrile, not tachycardic or hypoxic.  Patient in no acute distress.  Abdomen soft, nondistended, nontender.  Low suspicion for acute abdomen. Routine labs ordered to rule out electrolyte abnormalities and check kidney function.  Will give IV fluids and nausea medication.  CBC with no leukocytosis.  Mild anemia hemoglobin 1.3.  CMP reassuring.  Normal renal function.  No major electrolyte derangements.  Lipase normal.  Low suspicion for pancreatitis.  UA with proteinuria and ketonuria likely due to dehydration.  No evidence of infection. Advised patient to have urine rechecked by OB. BP not elevated after initial BP. Patient able to tolerate po without difficulty.  Abdomen soft, nontender, nontender.  Low suspicion for acute abdomen. Patient to have OB prescribe nausea medication based on their recommendations. Patient has B6. Patient stable for discharge. Strict ED precautions discussed with patient. Patient states understanding and agrees to plan. Patient discharged home in no acute distress and stable vitals  Has PCP Currently pregnant    Final diagnoses:  Nausea and vomiting, unspecified vomiting type  [redacted] weeks gestation of pregnancy  Proteinuria, unspecified type    ED Discharge Orders     None          Lorelle Aleck JAYSON DEVONNA 02/21/24 1557    Ruthe Cornet, DO 02/22/24 1143

## 2024-02-21 NOTE — Discharge Instructions (Addendum)
 It was a pleasure taking care of you today.  As discussed, your workup is reassuring.  Call your OB/GYN to discuss further recommendations for nausea and vomiting.  Return to the ER for any worsening symptoms.  Your urine had protein in it. Please have it rechecked by your OB.

## 2024-02-21 NOTE — ED Notes (Signed)
 Pt. Is eating crackers and drinking apple juice

## 2024-02-21 NOTE — ED Notes (Signed)
 IVF bolus complete, pt states she is feeling better.

## 2024-03-28 LAB — PANORAMA PRENATAL TEST FULL PANEL:PANORAMA TEST PLUS 5 ADDITIONAL MICRODELETIONS: FETAL FRACTION: 11

## 2024-04-17 ENCOUNTER — Inpatient Hospital Stay (HOSPITAL_COMMUNITY)
Admission: AD | Admit: 2024-04-17 | Discharge: 2024-04-17 | Disposition: A | Discharge status: Home / Self Care | Attending: Obstetrics and Gynecology | Admitting: Obstetrics and Gynecology

## 2024-04-17 DIAGNOSIS — O26892 Other specified pregnancy related conditions, second trimester: Secondary | ICD-10-CM | POA: Insufficient documentation

## 2024-04-17 DIAGNOSIS — Z3A15 15 weeks gestation of pregnancy: Secondary | ICD-10-CM | POA: Diagnosis not present

## 2024-04-17 DIAGNOSIS — R109 Unspecified abdominal pain: Secondary | ICD-10-CM

## 2024-04-17 DIAGNOSIS — R197 Diarrhea, unspecified: Secondary | ICD-10-CM | POA: Diagnosis not present

## 2024-04-17 DIAGNOSIS — R112 Nausea with vomiting, unspecified: Secondary | ICD-10-CM | POA: Diagnosis not present

## 2024-04-17 DIAGNOSIS — O2242 Hemorrhoids in pregnancy, second trimester: Secondary | ICD-10-CM | POA: Diagnosis not present

## 2024-04-17 DIAGNOSIS — R252 Cramp and spasm: Secondary | ICD-10-CM | POA: Diagnosis not present

## 2024-04-17 DIAGNOSIS — K089 Disorder of teeth and supporting structures, unspecified: Secondary | ICD-10-CM | POA: Insufficient documentation

## 2024-04-17 DIAGNOSIS — Z3A16 16 weeks gestation of pregnancy: Secondary | ICD-10-CM

## 2024-04-17 DIAGNOSIS — R21 Rash and other nonspecific skin eruption: Secondary | ICD-10-CM | POA: Insufficient documentation

## 2024-04-17 DIAGNOSIS — G4700 Insomnia, unspecified: Secondary | ICD-10-CM | POA: Insufficient documentation

## 2024-04-17 DIAGNOSIS — B370 Candidal stomatitis: Secondary | ICD-10-CM | POA: Insufficient documentation

## 2024-04-17 DIAGNOSIS — O209 Hemorrhage in early pregnancy, unspecified: Secondary | ICD-10-CM

## 2024-04-17 DIAGNOSIS — O99712 Diseases of the skin and subcutaneous tissue complicating pregnancy, second trimester: Secondary | ICD-10-CM | POA: Insufficient documentation

## 2024-04-17 DIAGNOSIS — O23592 Infection of other part of genital tract in pregnancy, second trimester: Secondary | ICD-10-CM | POA: Insufficient documentation

## 2024-04-17 DIAGNOSIS — M545 Low back pain, unspecified: Secondary | ICD-10-CM | POA: Diagnosis not present

## 2024-04-17 DIAGNOSIS — L700 Acne vulgaris: Secondary | ICD-10-CM | POA: Diagnosis not present

## 2024-04-17 LAB — URINALYSIS, ROUTINE W REFLEX MICROSCOPIC
Bilirubin Urine: NEGATIVE
Glucose, UA: NEGATIVE mg/dL
Hgb urine dipstick: NEGATIVE
Ketones, ur: NEGATIVE mg/dL
Nitrite: NEGATIVE
Protein, ur: NEGATIVE mg/dL
Specific Gravity, Urine: 1.026 (ref 1.005–1.030)
pH: 6 (ref 5.0–8.0)

## 2024-04-17 LAB — WET PREP, GENITAL
Clue Cells Wet Prep HPF POC: NONE SEEN
Sperm: NONE SEEN
Trich, Wet Prep: NONE SEEN
WBC, Wet Prep HPF POC: >=10 — AB (ref <10)
Yeast Wet Prep HPF POC: NONE SEEN

## 2024-04-17 NOTE — MAU Note (Signed)
..  Dorothy Wallace is a 23 y.o. at [redacted]w[redacted]d here in MAU reporting: Vaginal bleeding and abdominal pain that began 5 days ago. Reports the bleeding is mostly when she wipes and is pink. The pain is intermittent and has not taken anything for the cramping.  Gets care in [facility]. Reports she had a 12 week US  with an IUP. Onset of complaint: 5 days ago Pain score: 7/10 Vitals:   04/17/24 0655 04/17/24 0656  BP:  130/69  Pulse:  91  Resp:  15  Temp:  99 F (37.2 C)  SpO2: 100%      FHT:155 Lab orders placed from triage:  UA

## 2024-04-17 NOTE — MAU Provider Note (Cosign Needed Addendum)
 History     CSN: [id number]  Arrival date and time: 04/17/24 9386   Event Date/Time   First Provider Initiated Contact with Patient 04/17/24 0730      Chief Complaint  Patient presents with   Vaginal Bleeding   Abdominal Pain    Dorothy Wallace is a 23 y.o. G3P0020 at [redacted]w[redacted]d who receives care at [facility].  She reports her next appt is Nov 12.  She presents today for vaginal bleeding and abdominal pain.  She states the bleeding is with wiping and noted on her underwear, but not enough to wear a pantyliner.  She states the bleeding has been present for ~ 4 days and pain started around the same time.  She states the pain has been on and off.  She state the pain duration varies from 30 min to 1 hour and will stop for a few hours and come back.  She states the pain has no relieving or worsening factors. She rates the pain a 5.5 to a 7 and clarifies that it's not horrible.   OB History     Gravida  3   Para      Term      Preterm      AB  2   Living         SAB  2   IAB      Ectopic      Multiple      Live Births              Past Medical History:  Diagnosis Date   Environmental allergies    History of chlamydia    History of gonorrhea    History of miscarriage     No past surgical history on file.  Family History  Problem Relation Age of Onset   Stroke Mother    Multiple sclerosis Father     Social History   Tobacco Use   Smoking status: Never   Smokeless tobacco: Never  Vaping Use   Vaping status: Never Used  Substance Use Topics   Alcohol use: Yes   Drug use: Never    Allergies:  Allergies  Allergen Reactions   Bee Pollen Other (See Comments)    Cough; itchy dry eyes; sneezing, stuffy nose Cough; itchy dry eyes; sneezing, stuffy nose   Gramineae Pollens     Cough; itchy dry eyes; sneezing, stuffy nose   Pollen Extract Other (See Comments)    Cough; itchy dry eyes; sneezing, stuffy nose Cough; itchy dry eyes; sneezing, stuffy nose      Medications Prior to Admission  Medication Sig Dispense Refill Last Dose/Taking   Prenat-Fe Poly-Methfol-FA-DHA (VITAFOL  ULTRA) 29-0.6-0.4-200 MG CAPS Take 1 capsule by mouth daily. 30 capsule 12     Review of Systems  Constitutional:  Negative for chills and fever.  Gastrointestinal:  Positive for abdominal pain, constipation, nausea and vomiting. Negative for diarrhea.  Genitourinary:  Positive for vaginal bleeding. Negative for difficulty urinating, dysuria and vaginal discharge.  Neurological:  Negative for dizziness, light-headedness and headaches.   Physical Exam   Blood pressure 130/69, pulse 91, temperature 99 F (37.2 C), temperature source Oral, resp. rate 15, height 5' 9 (1.753 m), weight 102.5 kg, last menstrual period 01/01/2024, SpO2 100%.  Physical Exam Vitals reviewed.  Constitutional:      Appearance: Normal appearance. She is well-developed.  HENT:     Head: Normocephalic and atraumatic.  Eyes:     Conjunctiva/sclera: Conjunctivae normal.  Cardiovascular:  Rate and Rhythm: Normal rate.  Pulmonary:     Effort: Pulmonary effort is normal. No respiratory distress.  Musculoskeletal:        General: Normal range of motion.     Cervical back: Normal range of motion.  Skin:    General: Skin is warm and dry.  Neurological:     Mental Status: She is alert and oriented to person, place, and time.  Psychiatric:        Mood and Affect: Mood normal.        Behavior: Behavior normal.     MAU Course  Procedures Results for orders placed or performed during the hospital encounter of 04/17/24 (from the past 24 hours)  Wet prep, genital     Status: Abnormal   Collection Time: 04/17/24  7:03 AM  Result Value Ref Range   Yeast Wet Prep HPF POC NONE SEEN NONE SEEN   Trich, Wet Prep NONE SEEN NONE SEEN   Clue Cells Wet Prep HPF POC NONE SEEN NONE SEEN   WBC, Wet Prep HPF POC >=10 (A) <10   Sperm NONE SEEN   Urinalysis, Routine w reflex microscopic -Urine,  Clean Catch     Status: Abnormal   Collection Time: 04/17/24  7:13 AM  Result Value Ref Range   Color, Urine YELLOW YELLOW   APPearance HAZY (A) CLEAR   Specific Gravity, Urine 1.026 1.005 - 1.030   pH 6.0 5.0 - 8.0   Glucose, UA NEGATIVE NEGATIVE mg/dL   Hgb urine dipstick NEGATIVE NEGATIVE   Bilirubin Urine NEGATIVE NEGATIVE   Ketones, ur NEGATIVE NEGATIVE mg/dL   Protein, ur NEGATIVE NEGATIVE mg/dL   Nitrite NEGATIVE NEGATIVE   Leukocytes,Ua MODERATE (A) NEGATIVE   RBC / HPF 0-5 0 - 5 RBC/hpf   WBC, UA 6-10 0 - 5 WBC/hpf   Bacteria, UA RARE (A) NONE SEEN   Squamous Epithelial / HPF 0-5 0 - 5 /HPF   Mucus PRESENT     Patient informed that the ultrasound is considered a limited OB ultrasound and is not intended to be a complete ultrasound exam.  Patient also informed that the ultrasound is not being completed with the intent of assessing for fetal or placental anomalies or any pelvic abnormalities.  Explained that the purpose of today's ultrasound is to assess for  reassurance and viability.  Patient acknowledges the purpose of the exam and the limitations of the study.      SIUP with BPD of 15.4 Fetal movement appreciated Doppler of FHTs in 150s  MDM PE UA Wet prep & GC BSUS Assessment and Plan  23 year old, G3P0020  SIUP at 15.2 weeks Vaginal Spotting Abdominal Pain  -Wet prep collected by patient.  -Returns negative.  -UA as above.  -Discussed need to increase water intake. -Reassured that some light spotting can be common during early pregnancy and suggestive of vaginal irritation or placental issues. -Patient offered and declines pain medication. -Discussed usage of OTC medications and non-pharmacological techniques for management of abdominal pain at home. -List of pregnancy safe meds included in AVS.  -BSUS discussed and patient agreeable.  -Performed as above. -Patient reports reassurance with US . -Precautions reviewed. -Encouraged to call primary office  or return to MAU if symptoms worsen or with the onset of new symptoms. -Discharged to home in stable condition.   Harlene LITTIE Duncans MSN, CNM Advanced Practice Provider, [facility] 04/17/2024, 7:30 AM

## 2024-04-17 NOTE — Discharge Instructions (Signed)

## 2024-04-18 ENCOUNTER — Inpatient Hospital Stay (HOSPITAL_COMMUNITY)

## 2024-04-18 ENCOUNTER — Encounter (HOSPITAL_COMMUNITY): Payer: Self-pay | Admitting: Obstetrics and Gynecology

## 2024-04-18 ENCOUNTER — Other Ambulatory Visit: Payer: Self-pay

## 2024-04-18 ENCOUNTER — Observation Stay (HOSPITAL_COMMUNITY)
Admission: AD | Admit: 2024-04-18 | Discharge: 2024-04-19 | Disposition: A | Discharge status: Home / Self Care | Payer: Self-pay | Attending: Obstetrics and Gynecology | Admitting: Obstetrics and Gynecology

## 2024-04-18 DIAGNOSIS — Z3A15 15 weeks gestation of pregnancy: Secondary | ICD-10-CM

## 2024-04-18 DIAGNOSIS — Z8616 Personal history of COVID-19: Secondary | ICD-10-CM | POA: Insufficient documentation

## 2024-04-18 DIAGNOSIS — O26892 Other specified pregnancy related conditions, second trimester: Secondary | ICD-10-CM | POA: Diagnosis not present

## 2024-04-18 DIAGNOSIS — O42919 Preterm premature rupture of membranes, unspecified as to length of time between rupture and onset of labor, unspecified trimester: Principal | ICD-10-CM | POA: Diagnosis present

## 2024-04-18 DIAGNOSIS — O046 Delayed or excessive hemorrhage following (induced) termination of pregnancy: Principal | ICD-10-CM

## 2024-04-18 DIAGNOSIS — O42913 Preterm premature rupture of membranes, unspecified as to length of time between rupture and onset of labor, third trimester: Secondary | ICD-10-CM

## 2024-04-18 LAB — CBC
HCT: 32.9 % — ABNORMAL LOW (ref 36.0–46.0)
Hemoglobin: 10.9 g/dL — ABNORMAL LOW (ref 12.0–15.0)
MCH: 26.5 pg (ref 26.0–34.0)
MCHC: 33.1 g/dL (ref 30.0–36.0)
MCV: 80 fL (ref 80.0–100.0)
Platelets: 222 K/uL (ref 150–400)
RBC: 4.11 MIL/uL (ref 3.87–5.11)
RDW: 17 % — ABNORMAL HIGH (ref 11.5–15.5)
WBC: 9.9 K/uL (ref 4.0–10.5)
nRBC: 0 % (ref 0.0–0.2)

## 2024-04-18 LAB — COMPREHENSIVE METABOLIC PANEL WITH GFR
ALT: 16 U/L (ref 0–44)
AST: 31 U/L (ref 15–41)
Albumin: 3.5 g/dL (ref 3.5–5.0)
Alkaline Phosphatase: 64 U/L (ref 38–126)
Anion gap: 12 (ref 5–15)
BUN: 5 mg/dL — ABNORMAL LOW (ref 6–20)
CO2: 18 mmol/L — ABNORMAL LOW (ref 22–32)
Calcium: 9.1 mg/dL (ref 8.9–10.3)
Chloride: 103 mmol/L (ref 98–111)
Creatinine, Ser: 0.54 mg/dL (ref 0.44–1.00)
GFR, Estimated: >60 mL/min (ref >60)
Glucose, Bld: 70 mg/dL (ref 70–99)
Potassium: 4 mmol/L (ref 3.5–5.1)
Sodium: 133 mmol/L — ABNORMAL LOW (ref 135–145)
Total Bilirubin: 0.7 mg/dL (ref 0.0–1.2)
Total Protein: 7.8 g/dL (ref 6.5–8.1)

## 2024-04-18 LAB — GC/CHLAMYDIA PROBE AMP (~~LOC~~) NOT AT ARMC
Chlamydia: NEGATIVE
Comment: NEGATIVE
Comment: NORMAL
Neisseria Gonorrhea: NEGATIVE

## 2024-04-18 LAB — POCT FERN TEST: POCT Fern Test: POSITIVE

## 2024-04-18 MED ORDER — ONDANSETRON HCL 4 MG/2ML IJ SOLN
4.0000 mg | Freq: Four times a day (QID) | INTRAMUSCULAR | Status: DC | PRN
Start: 2024-04-18 — End: 2024-04-19

## 2024-04-18 MED ORDER — ACETAMINOPHEN 500 MG PO TABS
1000.0000 mg | ORAL_TABLET | Freq: Four times a day (QID) | ORAL | Status: DC | PRN
  Administered 2024-04-18: 1000 mg via ORAL
  Filled 2024-04-18: qty 2

## 2024-04-18 MED ORDER — LACTATED RINGERS IV SOLN: 125.0000 mL/h | INTRAVENOUS | Status: DC

## 2024-04-18 MED ORDER — LACTATED RINGERS IV SOLN: INTRAVENOUS | Status: DC

## 2024-04-18 MED ORDER — MISOPROSTOL 200 MCG PO TABS
400.0000 ug | ORAL_TABLET | ORAL | Status: DC
Start: 2024-04-19 — End: 2024-04-19

## 2024-04-18 MED ORDER — KETOROLAC TROMETHAMINE 30 MG/ML IJ SOLN: 15.0000 mg | Freq: Four times a day (QID) | INTRAMUSCULAR | Status: DC | PRN

## 2024-04-18 MED ORDER — KETOROLAC TROMETHAMINE 30 MG/ML IJ SOLN
INTRAMUSCULAR | Status: AC
  Administered 2024-04-18: 15 mg via INTRAVENOUS
  Filled 2024-04-18: qty 1

## 2024-04-18 MED ORDER — LACTATED RINGERS IV BOLUS
1000.0000 mL | Freq: Once | INTRAVENOUS | Status: AC
  Administered 2024-04-18: 1000 mL via INTRAVENOUS

## 2024-04-18 MED ORDER — CALCIUM CARBONATE ANTACID 500 MG PO CHEW: 2.0000 | CHEWABLE_TABLET | ORAL | Status: DC | PRN

## 2024-04-18 MED ORDER — KETOROLAC TROMETHAMINE 30 MG/ML IJ SOLN: 15.0000 mg | Freq: Once | INTRAMUSCULAR | Status: AC

## 2024-04-18 MED ORDER — ONDANSETRON HCL 4 MG/2ML IJ SOLN
4.0000 mg | Freq: Once | INTRAMUSCULAR | Status: AC
  Administered 2024-04-18: 4 mg via INTRAVENOUS
  Filled 2024-04-18: qty 2

## 2024-04-18 MED ORDER — DOCUSATE SODIUM 100 MG PO CAPS: 100.0000 mg | ORAL_CAPSULE | Freq: Every day | ORAL | Status: DC

## 2024-04-18 MED ORDER — MISOPROSTOL 200 MCG PO TABS
400.0000 ug | ORAL_TABLET | ORAL | Status: DC
  Administered 2024-04-18 (×4): 400 ug via VAGINAL
  Filled 2024-04-18 (×4): qty 2

## 2024-04-18 MED ORDER — HYDROMORPHONE HCL 1 MG/ML IJ SOLN
0.2000 mg | INTRAMUSCULAR | Status: DC | PRN
  Administered 2024-04-19: 0.5 mg via INTRAVENOUS
  Filled 2024-04-18: qty 0.5

## 2024-04-18 NOTE — MAU Provider Note (Addendum)
 Chief Complaint:  Abdominal Pain, Vaginal Bleeding, and Vaginal Discharge   HPI      Dorothy Wallace is a 23 y.o. G3P0020 at [redacted]w[redacted]d who presents to maternity admissions reporting vaginal fluid. She reports waking with a Gush at  0600 with wet underwear and a wet spot on the bed. She is still leaking clear watery discharge and has been wearing a pad. She endorses lower abdominal cramping and some spotting and denies taking anything for pain but declines medication at this time.  Pregnancy Course: Receives care at [facility]. Prenatal records reviewed.   Past Medical History:  Diagnosis Date   Environmental allergies    History of chlamydia    History of gonorrhea    History of miscarriage    Medical history non-contributory    OB History  Gravida Para Term Preterm AB Living  3    2   SAB IAB Ectopic Multiple Live Births  2        # Outcome Date GA Lbr Len/2nd Weight Sex Type Anes PTL Lv  3 Current           2 SAB 08/29/21          1 SAB 10/09/19           Past Surgical History:  Procedure Laterality Date   NO PAST SURGERIES     Family History  Problem Relation Age of Onset   Stroke Mother    Multiple sclerosis Father    Social History   Tobacco Use   Smoking status: Never   Smokeless tobacco: Never  Vaping Use   Vaping status: Never Used  Substance Use Topics   Alcohol use: Yes   Drug use: Never   Allergies  Allergen Reactions   Bee Pollen Other (See Comments)    Cough; itchy dry eyes; sneezing, stuffy nose Cough; itchy dry eyes; sneezing, stuffy nose   Gramineae Pollens     Cough; itchy dry eyes; sneezing, stuffy nose   Pollen Extract Other (See Comments)    Cough; itchy dry eyes; sneezing, stuffy nose Cough; itchy dry eyes; sneezing, stuffy nose    Medications Prior to Admission  Medication Sig Dispense Refill Last Dose/Taking   Prenat-Fe Poly-Methfol-FA-DHA (VITAFOL  ULTRA) 29-0.6-0.4-200 MG CAPS Take 1 capsule by mouth daily. 30 capsule 12 04/17/2024    I  have reviewed patient's Past Medical Hx, Surgical Hx, Family Hx, Social Hx, medications and allergies.   ROS  Pertinent items noted in HPI and remainder of comprehensive ROS otherwise negative.   PHYSICAL EXAM  Patient Vitals for the past 24 hrs:  BP Temp Temp src Pulse Resp SpO2 Height Weight  04/18/24 0752 134/78 -- -- 93 16 100 % -- --  04/18/24 0732 137/77 99.3 F (37.4 C) Oral -- 17 100 % 5' 9 (1.753 m) 102.9 kg    Constitutional: Well-developed, obese  female in no acute distress.  HEENT: atraumatic, normocephalic. Neck has normal ROM. EOM intact. Cardiovascular: normal rate & rhythm, warm and well-perfused Respiratory: normal effort, no problems with respiration noted GI: Abd soft, b/l lower abdominal cramping and mildly tender to touch MSK: Extremities nontender, no edema, normal ROM Skin: warm and dry. Acyanotic, no jaundice or pallor. Neurologic: Alert and oriented x 4.  Psychiatric: Normal mood. Speech not slurred, not rapid/pressured. Patient is cooperative, but  tearful     Labs: Results for orders placed or performed during the hospital encounter of 04/18/24 (from the past 24 hours)  [facility]  Status: None   Collection Time: 04/18/24  8:09 AM  Result Value Ref Range   POCT Fern Test Positive = ruptured amniotic membanes   CBC     Status: Abnormal   Collection Time: 04/18/24  8:16 AM  Result Value Ref Range   WBC 9.9 4.0 - 10.5 K/uL   RBC 4.11 3.87 - 5.11 MIL/uL   Hemoglobin 10.9 (L) 12.0 - 15.0 g/dL   HCT 67.0 (L) 63.9 - 53.9 %   MCV 80.0 80.0 - 100.0 fL   MCH 26.5 26.0 - 34.0 pg   MCHC 33.1 30.0 - 36.0 g/dL   RDW 82.9 (H) 88.4 - 84.4 %   Platelets 222 150 - 400 K/uL   nRBC 0.0 0.0 - 0.2 %  Comprehensive metabolic panel     Status: Abnormal   Collection Time: 04/18/24  8:16 AM  Result Value Ref Range   Sodium 133 (L) 135 - 145 mmol/L   Potassium 4.0 3.5 - 5.1 mmol/L   Chloride 103 98 - 111 mmol/L   CO2 18 (L) 22 - 32 mmol/L   Glucose, Bld 70 70 -  99 mg/dL   BUN 5 (L) 6 - 20 mg/dL   Creatinine, Ser 9.45 0.44 - 1.00 mg/dL   Calcium 9.1 8.9 - 89.6 mg/dL   Total Protein 7.8 6.5 - 8.1 g/dL   Albumin 3.5 3.5 - 5.0 g/dL   AST 31 15 - 41 U/L   ALT 16 0 - 44 U/L   Alkaline Phosphatase 64 38 - 126 U/L   Total Bilirubin 0.7 0.0 - 1.2 mg/dL   GFR, Estimated >39 >39 mL/min   Anion gap 12 5 - 15  Type and screen [facility]     Status: None (Preliminary result)   Collection Time: 04/18/24  8:40 AM  Result Value Ref Range   ABO/RH(D) B POS    Antibody Screen PENDING    Sample Expiration      04/21/2024,2359 Performed at [facility], [address], [place], [place] [address]     Imaging:  No results found.  MDM & MAU COURSE  MDM:   HIGH  PPROM at 15.3 GA with abdominal cramping   I resumed care of this patient at 0800. L. Safaa Stingley, NP Pelvic exam : Chaperoned by Jeoffrey Ade RN SSE: Pooling of clear fluid, Fern Positive, Vaginal cultures with wet prep negative (10/30) Cervix viably closed   CBC: Hgb 10.9 CMP: NM Type and screen: B Positive    Media Information      Document Information  Ultrasound  OB  limited  04/18/2024 09:04  Attached To:  US  MFM OB LIMITED [[id number]]  Hospital Encounter on 04/18/24  Source Information  Tester, Mesha T  [facility] Ultrasound    MAU Course: -Vital signs within normal limits. Unable to get FHT in triage.  0900/ D/W Dr Erik [facility] MAU  Attending)  Patient is requesting medical management at this time and admission  Dr Erik will speak to patient at the bedside    Orders Placed This Encounter  Procedures   US  MFM OB LIMITED   CBC   Comprehensive metabolic panel   Fern Test   Type and screen [facility]   Saline lock IV   No orders of the defined types were placed in this encounter.   ASSESSMENT   1. Preterm premature rupture of membranes (PPROM) with unknown onset of labor   2. [redacted] weeks gestation of  pregnancy      PLAN  Shared decision making with Dr Erik and the patient TBD     Allergies as of 04/17/2024 - Review Complete 02/21/2024  Allergen Reaction Noted   Bee pollen Other (See Comments) 10/08/2017   Gramineae pollens  10/08/2017   Pollen extract Other (See Comments) 10/08/2017

## 2024-04-18 NOTE — Progress Notes (Signed)
 Diagnosis discussed with patient, appropriate support given to her.  Patient given options of expectant management or proceeding with delivery via induction of labor.  I discussed risks of expectant management: Intrauterine infection and endometritis (increased to 60%), sepsis (increased to 5 to 6%), and death related to septic infections (up to 0.05%).  I also discussed fetal complications related to expectant management. The rate of fetal survival to birth is approximately 50%.   We discussed exclusions to expectant management would be infection/chorio/sepsis and abruption as well as fetal demise. We discussed that in order to resuscitate at delivery, the gestational age would need to be 23 weeks or greater and be greater than 400g. We reviewed our management for expectant management is admission to the hospital for about 24 hours to receive IV antibiotics (Ampicillin x24 hours, and Azithromycin  1g IV) and then home on an oral regimen for an additional 6 days. Some studies have showed that this may prolong the latency time period, but this data is not robust. During that time, we would monitor for labor, signs of infection, and abruption. After this overnight hospitalization, we would then discharge her with precautions for abruption/infection/delivery. If none of these circumstances develop, she would then be re-admitted to the hospital at [redacted]w[redacted]d for betamethasone administration and hospitalization until delivery.   For previable PPROM that occurs between 16-20 weeks, the rates of fetal survival to discharge is approximately 15-17%. At 15 weeks, survival rate is likely lower. For previable PPROM that occurs between 20-24 weeks, the rates of fetal survival to discharge is approximately 40-50%. I also discussed that delivery after viability results in poor prognosis with complications to the baby include: hypoplastic lungs, necrotizing enterocolitis, intrauterine growth restriction, limb deformities (including  shortened fetal limbs and club feet), fetal compression syndrome, physical and cognitive developmental delays, and fetal and neonatal death.   We discussed that the NICU can provide more details of outcomes and risks.  NICU consult was offered and declined  I offered the patient opportunity to ask questions - all questions answered. The patient was given time to consider options and to counsel with partner/family.   I discussed the process and risks of medical termination.   At this point, the patient wishes to proceed with medical termination.  Due to the very real risk to maternal health and rapid decline associated with infections related to previable PPROM, it is necessary to forego the 72 hour waiting period and proceed with induction with medical abortion.  I reviewed [facility] MEDICAL ABORTION CONSENT FORM AND ACKNOWLEDGEMENT OF RISKS STATEMENT form with patient. [facility] PHYSICIAN DECLARATION FORM FOR MEDICAL AND SURGICAL ABORTIONS form was filled out.   Kieth Carolin, MD Obstetrician & Gynecologist, [facility] for [employer], [facility]

## 2024-04-18 NOTE — Telephone Encounter (Signed)
 Obstetrics On Call Telephone Note  Patient called obstetrics on-call pager at 0615  Ms. Dorothy Wallace is a 23 y.o. G3P0020 F at [redacted]w[redacted]d who reports she woke up leaking fluid and continuing to leak fluid at 6am. Denies VB, does report associated contractions/cramping. As patient is <20 weeks, instructed patient to go to ED for evaluation.  Maurilio Every, MD OB/GYN PGY-1 [facility]

## 2024-04-18 NOTE — Plan of Care (Signed)
   Problem: Education: Goal: Knowledge of General Education information will improve Description: Including pain rating scale, medication(s)/side effects and non-pharmacologic comfort measures Outcome: Completed/Met

## 2024-04-18 NOTE — Progress Notes (Signed)
 Progress Note  In to see patient for cytotec dose #4. She is doing well without complaint. Cramping has increased a bit, currently a 5-6 out of 10. Afebrile with Tmax 37.6 (after receiving cytotec). FHR was still present at 5pm today, but we opted not to recheck with this medication dose. Continue cytotec 400mcg q3h prn.   Kieth Carolin, MD Obstetrician & Gynecologist, [facility] for [employer], [facility]

## 2024-04-18 NOTE — H&P (Signed)
 FACULTY PRACTICE ANTEPARTUM ADMISSION HISTORY AND PHYSICAL NOTE   History of Present Illness: Dorothy Wallace is a 23 y.o. G3P0020 at [redacted]w[redacted]d (LMP c/w 7w US ) admitted for induction termination for previable PPROM.   Patient seen in MAU yesterday with cramping and bleeding. Workup was unremarkable and she was discharged home. This morning, she woke up with a large gush of fluid. She continued to have leaking of clear fluid. Exam in MAU by Olam Dalton, NP was consistent with previable PPROM with positive pooling and positive fern test. FHR present, breech presentation. No fevers, chills, leukocytosis, fundal tenderness or purulent vaginal discharge. She was counseled on diagnosis and was leaning towards induction termination/medical abortion.   During her time in MAU, she started having more bleeding and severe abdominal pain, rating it an 8 out of 10 at time of my evaluation. I repeated the exam. She was grossly ruptured with copious amniotic fluid on the perineum, pooling of clear fluid. Cervix was visually dilated with membranes prolapsing 2-3cm beyond the external os. She was unable to tolerate a digital cervical exam. Repeat bedside US  performed by myself had minimal amniotic fluid surrounding breech fetus. FHR <120, but present. We discussed options for management including expectant, medical or surgical management and she opted for medical management (see details below).   Patient Active Problem List   Diagnosis Date Noted   Preterm premature rupture of membranes (PPROM) with unknown onset of labor 04/18/2024   COVID-19 11/10/2019   Chlamydia infection 10/09/2017   Seasonal allergic rhinitis due to pollen 10/08/2017   Dysthymia 01/21/2016    Past Medical History:  Diagnosis Date   Environmental allergies    History of chlamydia    History of gonorrhea    History of miscarriage    Medical history non-contributory     Past Surgical History:  Procedure Laterality Date   NO PAST SURGERIES       OB History  Gravida Para Term Preterm AB Living  3    2   SAB IAB Ectopic Multiple Live Births  2        # Outcome Date GA Lbr Len/2nd Weight Sex Type Anes PTL Lv  3 Current           2 SAB 08/29/21          1 SAB 10/09/19            Social History   Socioeconomic History   Marital status: Single    Spouse name: Not on file   Number of children: Not on file   Years of education: Not on file   Highest education level: Not on file  Occupational History   Not on file  Tobacco Use   Smoking status: Never   Smokeless tobacco: Never  Vaping Use   Vaping status: Never Used  Substance and Sexual Activity   Alcohol use: Yes   Drug use: Never   Sexual activity: Yes    Birth control/protection: Other-see comments    Comment: ring  Other Topics Concern   Not on file  Social History Narrative   Not on file   Social Drivers of Health   Financial Resource Strain: Not on file  Food Insecurity: No Food Insecurity (04/18/2024)   Hunger Vital Sign    Worried About Running Out of Food in the Last Year: Never true    Ran Out of Food in the Last Year: Never true  Transportation Needs: No Transportation Needs (04/18/2024)   PRAPARE - Transportation  Lack of Transportation (Medical): No    Lack of Transportation (Non-Medical): No  Physical Activity: Not on file  Stress: Not on file  Social Connections: Not on file    Family History  Problem Relation Age of Onset   Stroke Mother    Multiple sclerosis Father     Allergies  Allergen Reactions   Bee Pollen Other (See Comments)    Cough; itchy dry eyes; sneezing, stuffy nose Cough; itchy dry eyes; sneezing, stuffy nose   Gramineae Pollens     Cough; itchy dry eyes; sneezing, stuffy nose   Pollen Extract Other (See Comments)    Cough; itchy dry eyes; sneezing, stuffy nose Cough; itchy dry eyes; sneezing, stuffy nose     Medications Prior to Admission  Medication Sig Dispense Refill Last Dose/Taking   Prenat-Fe  Poly-Methfol-FA-DHA (VITAFOL  ULTRA) 29-0.6-0.4-200 MG CAPS Take 1 capsule by mouth daily. 30 capsule 12 04/17/2024    Review of Systems - Negative except as noted in the HPI  Vitals:  BP 133/76 (BP Location: Right Arm)   Pulse 87   Temp 98.3 F (36.8 C) (Oral)   Resp 17   Ht 5' 9 (1.753 m)   Wt 102.9 kg   LMP 01/01/2024   SpO2 100%   BMI 33.49 kg/m  Physical Examination: CONSTITUTIONAL: Well-developed, well-nourished female; appears moderately uncomfortable with cramping NEUROLOGIC: Alert and oriented to person, place, and time. PSYCHIATRIC: Normal mood and affect. Normal behavior. Normal judgment and thought content. CARDIOVASCULAR: Normal heart rate noted RESPIRATORY: Effort normal, no problems with respiration noted ABDOMEN: Soft, mild b/l lower quadrant tenderness without rebound or guarding  Cervix: Visually 1cm dilated with prolapsing yellow membranes. Pooling of clear fluid. No active heavy bleeding. No purulence Membranes: ruptured FHR: see nursing documentation, varies  Labs:  Results for orders placed or performed during the hospital encounter of 04/18/24 (from the past 24 hours)  Fern Test   Collection Time: 04/18/24  8:09 AM  Result Value Ref Range   POCT Fern Test Positive = ruptured amniotic membanes   CBC   Collection Time: 04/18/24  8:16 AM  Result Value Ref Range   WBC 9.9 4.0 - 10.5 K/uL   RBC 4.11 3.87 - 5.11 MIL/uL   Hemoglobin 10.9 (L) 12.0 - 15.0 g/dL   HCT 67.0 (L) 63.9 - 53.9 %   MCV 80.0 80.0 - 100.0 fL   MCH 26.5 26.0 - 34.0 pg   MCHC 33.1 30.0 - 36.0 g/dL   RDW 82.9 (H) 88.4 - 84.4 %   Platelets 222 150 - 400 K/uL   nRBC 0.0 0.0 - 0.2 %  Comprehensive metabolic panel   Collection Time: 04/18/24  8:16 AM  Result Value Ref Range   Sodium 133 (L) 135 - 145 mmol/L   Potassium 4.0 3.5 - 5.1 mmol/L   Chloride 103 98 - 111 mmol/L   CO2 18 (L) 22 - 32 mmol/L   Glucose, Bld 70 70 - 99 mg/dL   BUN 5 (L) 6 - 20 mg/dL   Creatinine, Ser 9.45 0.44  - 1.00 mg/dL   Calcium 9.1 8.9 - 89.6 mg/dL   Total Protein 7.8 6.5 - 8.1 g/dL   Albumin 3.5 3.5 - 5.0 g/dL   AST 31 15 - 41 U/L   ALT 16 0 - 44 U/L   Alkaline Phosphatase 64 38 - 126 U/L   Total Bilirubin 0.7 0.0 - 1.2 mg/dL   GFR, Estimated >39 >39 mL/min   Anion gap 12 5 -  15  Type and screen [facility]   Collection Time: 04/18/24  8:40 AM  Result Value Ref Range   ABO/RH(D) B POS    Antibody Screen NEG    Sample Expiration      04/21/2024,2359 Performed at [facility], [address] [place], [place] [address]    Imaging Studies: No results found.  Assessment and Plan: 23yo 08/18 at [redacted]w[redacted]d (LMP=7wk US ) admitted for induction termination/medication abortion in the setting of previable PPROM.   See previous progress note for details of my counseling.   In brief, patient was counseled on diagnosis of previable PPROM at 15 weeks and concern for miscarriage in process due to her pain, bleeding, open cervix and ruptured membranes. We discussed options for management including expectant management or pregnancy termination either via medication or surgical abortion.  As she does not have definitive evidence of chorioamnionitis or abruption and FHT are present, we discussed expectant management with inpatient observation. While overall prognosis is poor, in rare circumstances women will reseal membranes or achieve latency to viability. This still carries fetal risks which include pulmonary hypoplasia, IUFD, neonatal demise, limb contractures as well as maternal risks including abruption with hemorrhage and chorioamnionitis with development of sepsis. Maternal morbidity is twice as high with expectant management compared to pregnancy termination when it comes to previable PPROM. We then discussed the risks of medical or surgical termination including bleeding, infection, incomplete procedure (I.e., retained POCS and need for surgical intervention).  After discussion  of risks/benefits, she would like to proceed with medication termination as previously documented. The appropriate state documentation was completed as well as a procedure consent. The patient has be provided with copies of both consents.  She does not have evidence of active infection, but I am concerned about maternal health as membranes appear abnormal (yellow), she has cervical motion tenderness, and lower abdomen is tender. If we progress to overt infection, surgical management will be strongly recommended.   Plan: - Admit to [facility] - Misoprostol 400mcg PV q3h - first dose placed at 2:15p - Regular diet, activity as tolerated - Tylenol, toradol , dilaudid prn; zofran prn  Kieth Carolin, MD Obstetrician & Gynecologist, Faculty Practice Faculty Practice, [facility]

## 2024-04-18 NOTE — MAU Note (Signed)
 Dorothy Wallace is a 23 y.o. at [redacted]w[redacted]d here in MAU reporting:  at 0600 woke up and her underwear were wet,  bed was wet.  Has a pad on, still leaking now. Was clear and watery. Had some spotting. Cramping in lower abd.  Onset of complaint: 0600 Pain score: 9 Vitals:   04/18/24 0732  BP: 137/77  Resp: 17  Temp: 99.3 F (37.4 C)  SpO2: 100%     QYU:lwjaoz to hear Lab orders placed from triage:

## 2024-04-19 ENCOUNTER — Observation Stay (HOSPITAL_COMMUNITY): Admitting: Anesthesiology

## 2024-04-19 ENCOUNTER — Encounter (HOSPITAL_COMMUNITY): Payer: Self-pay | Admitting: Obstetrics and Gynecology

## 2024-04-19 ENCOUNTER — Encounter (HOSPITAL_COMMUNITY)
Admission: AD | Disposition: A | Discharge status: Home / Self Care | Payer: Self-pay | Source: Home / Self Care | Attending: Obstetrics and Gynecology

## 2024-04-19 DIAGNOSIS — Z3A00 Weeks of gestation of pregnancy not specified: Secondary | ICD-10-CM | POA: Diagnosis not present

## 2024-04-19 DIAGNOSIS — O046 Delayed or excessive hemorrhage following (induced) termination of pregnancy: Secondary | ICD-10-CM

## 2024-04-19 HISTORY — PX: DILATION AND CURETTAGE OF UTERUS: SHX78

## 2024-04-19 LAB — CBC
HCT: 18.8 % — ABNORMAL LOW (ref 36.0–46.0)
HCT: 28.2 % — ABNORMAL LOW (ref 36.0–46.0)
Hemoglobin: 6.1 g/dL — CL (ref 12.0–15.0)
Hemoglobin: 9.5 g/dL — ABNORMAL LOW (ref 12.0–15.0)
MCH: 25.8 pg — ABNORMAL LOW (ref 26.0–34.0)
MCH: 26.8 pg (ref 26.0–34.0)
MCHC: 32.4 g/dL (ref 30.0–36.0)
MCHC: 33.7 g/dL (ref 30.0–36.0)
MCV: 79.7 fL — ABNORMAL LOW (ref 80.0–100.0)
MCV: 79.4 fL — ABNORMAL LOW (ref 80.0–100.0)
Platelets: 142 K/uL — ABNORMAL LOW (ref 150–400)
Platelets: 187 K/uL (ref 150–400)
RBC: 2.36 MIL/uL — ABNORMAL LOW (ref 3.87–5.11)
RBC: 3.55 MIL/uL — ABNORMAL LOW (ref 3.87–5.11)
RDW: 17.2 % — ABNORMAL HIGH (ref 11.5–15.5)
RDW: 16.2 % — ABNORMAL HIGH (ref 11.5–15.5)
WBC: 9.4 K/uL (ref 4.0–10.5)
WBC: 14.8 K/uL — ABNORMAL HIGH (ref 4.0–10.5)
nRBC: 0 % (ref 0.0–0.2)
nRBC: 0 % (ref 0.0–0.2)

## 2024-04-19 LAB — COMPREHENSIVE METABOLIC PANEL WITH GFR
ALT: 7 U/L (ref 0–44)
AST: 10 U/L — ABNORMAL LOW (ref 15–41)
Albumin: 2 g/dL — ABNORMAL LOW (ref 3.5–5.0)
Alkaline Phosphatase: 29 U/L — ABNORMAL LOW (ref 38–126)
Anion gap: 7 (ref 5–15)
BUN: <5 mg/dL — ABNORMAL LOW (ref 6–20)
CO2: 17 mmol/L — ABNORMAL LOW (ref 22–32)
Calcium: 7.1 mg/dL — ABNORMAL LOW (ref 8.9–10.3)
Chloride: 107 mmol/L (ref 98–111)
Creatinine, Ser: 0.43 mg/dL — ABNORMAL LOW (ref 0.44–1.00)
GFR, Estimated: >60 mL/min
Glucose, Bld: 104 mg/dL — ABNORMAL HIGH (ref 70–99)
Potassium: 3.6 mmol/L (ref 3.5–5.1)
Sodium: 131 mmol/L — ABNORMAL LOW (ref 135–145)
Total Bilirubin: 0.4 mg/dL (ref 0.0–1.2)
Total Protein: 4.2 g/dL — ABNORMAL LOW (ref 6.5–8.1)

## 2024-04-19 LAB — PROTIME-INR
INR: 1.3 — ABNORMAL HIGH (ref 0.8–1.2)
Prothrombin Time: 16.7 s — ABNORMAL HIGH (ref 11.4–15.2)

## 2024-04-19 LAB — APTT: aPTT: 37 s — ABNORMAL HIGH (ref 24–36)

## 2024-04-19 LAB — FIBRINOGEN: Fibrinogen: 352 mg/dL (ref 210–475)

## 2024-04-19 LAB — PREPARE RBC (CROSSMATCH)

## 2024-04-19 LAB — POSTPARTUM HEMORRHAGE (BB NOTIFICATION)

## 2024-04-19 SURGERY — DILATION AND CURETTAGE
Anesthesia: General | Laterality: Bilateral

## 2024-04-19 MED ORDER — OXYCODONE-ACETAMINOPHEN 5-325 MG PO TABS: 1.0000 | ORAL_TABLET | ORAL | Status: DC | PRN

## 2024-04-19 MED ORDER — LACTATED RINGERS IV SOLN: INTRAVENOUS | Status: DC

## 2024-04-19 MED ORDER — SUGAMMADEX SODIUM 200 MG/2ML IV SOLN
INTRAVENOUS | Status: DC | PRN
  Administered 2024-04-19: 200 mg via INTRAVENOUS

## 2024-04-19 MED ORDER — SODIUM CHLORIDE 0.9 % IR SOLN
Status: DC | PRN
  Administered 2024-04-19: 1

## 2024-04-19 MED ORDER — MIDAZOLAM HCL (PF) 2 MG/2ML IJ SOLN
INTRAMUSCULAR | Status: DC | PRN
  Administered 2024-04-19: 2 mg via INTRAVENOUS

## 2024-04-19 MED ORDER — LACTATED RINGERS IV BOLUS: 1000.0000 mL | Freq: Once | INTRAVENOUS | Status: DC

## 2024-04-19 MED ORDER — OXYCODONE HCL 5 MG PO TABS: 5.0000 mg | ORAL_TABLET | Freq: Once | ORAL | Status: DC | PRN

## 2024-04-19 MED ORDER — ACETAMINOPHEN 10 MG/ML IV SOLN: 1000.0000 mg | Freq: Once | INTRAVENOUS | Status: DC | PRN

## 2024-04-19 MED ORDER — MIDAZOLAM HCL 2 MG/2ML IJ SOLN
INTRAMUSCULAR | Status: AC
  Filled 2024-04-19: qty 2

## 2024-04-19 MED ORDER — ONDANSETRON HCL 4 MG/2ML IJ SOLN: 4.0000 mg | Freq: Four times a day (QID) | INTRAMUSCULAR | Status: DC | PRN

## 2024-04-19 MED ORDER — ONDANSETRON HCL 4 MG/2ML IJ SOLN
INTRAMUSCULAR | Status: DC | PRN
  Administered 2024-04-19: 4 mg via INTRAVENOUS

## 2024-04-19 MED ORDER — DEXAMETHASONE SOD PHOSPHATE PF 10 MG/ML IJ SOLN
INTRAMUSCULAR | Status: DC | PRN
  Administered 2024-04-19: 10 mg via INTRAVENOUS

## 2024-04-19 MED ORDER — FENTANYL CITRATE (PF) 100 MCG/2ML IJ SOLN
INTRAMUSCULAR | Status: AC
  Filled 2024-04-19: qty 2

## 2024-04-19 MED ORDER — SOD CITRATE-CITRIC ACID 500-334 MG/5ML PO SOLN: 30.0000 mL | Freq: Once | ORAL | Status: DC

## 2024-04-19 MED ORDER — ROCURONIUM BROMIDE 100 MG/10ML IV SOLN
INTRAVENOUS | Status: DC | PRN
  Administered 2024-04-19: 30 mg via INTRAVENOUS

## 2024-04-19 MED ORDER — FENTANYL CITRATE (PF) 100 MCG/2ML IJ SOLN
INTRAMUSCULAR | Status: DC | PRN
  Administered 2024-04-19: 100 ug via INTRAVENOUS
  Administered 2024-04-19: 50 ug via INTRAVENOUS

## 2024-04-19 MED ORDER — TRANEXAMIC ACID-NACL 1000-0.7 MG/100ML-% IV SOLN
INTRAVENOUS | Status: AC
  Filled 2024-04-19: qty 100

## 2024-04-19 MED ORDER — SODIUM CHLORIDE 0.9% IV SOLUTION: Freq: Once | INTRAVENOUS | Status: DC

## 2024-04-19 MED ORDER — ONDANSETRON HCL 4 MG PO TABS: 4.0000 mg | ORAL_TABLET | Freq: Four times a day (QID) | ORAL | Status: DC | PRN

## 2024-04-19 MED ORDER — MENTHOL 3 MG MT LOZG
1.0000 | LOZENGE | OROMUCOSAL | Status: DC | PRN
  Administered 2024-04-19: 3 mg via ORAL
  Filled 2024-04-19: qty 9

## 2024-04-19 MED ORDER — FERROUS SULFATE 325 (65 FE) MG PO TABS: 325.0000 mg | ORAL_TABLET | ORAL | 0 refills | Status: AC

## 2024-04-19 MED ORDER — PHENYLEPHRINE HCL (PRESSORS) 10 MG/ML IV SOLN
INTRAVENOUS | Status: DC | PRN
  Administered 2024-04-19: 80 ug via INTRAVENOUS

## 2024-04-19 MED ORDER — PROPOFOL 10 MG/ML IV BOLUS
INTRAVENOUS | Status: DC | PRN
  Administered 2024-04-19: 150 mg via INTRAVENOUS

## 2024-04-19 MED ORDER — SUCCINYLCHOLINE CHLORIDE 200 MG/10ML IV SOSY
PREFILLED_SYRINGE | INTRAVENOUS | Status: DC | PRN
  Administered 2024-04-19: 120 mg via INTRAVENOUS

## 2024-04-19 MED ORDER — METHYLERGONOVINE MALEATE 0.2 MG/ML IJ SOLN
INTRAMUSCULAR | Status: DC | PRN
  Administered 2024-04-19: .2 mg via INTRAMUSCULAR

## 2024-04-19 MED ORDER — DOXYCYCLINE HYCLATE 100 MG IV SOLR
200.0000 mg | Freq: Once | INTRAVENOUS | Status: AC
  Administered 2024-04-19: 200 mg via INTRAVENOUS
  Filled 2024-04-19: qty 200

## 2024-04-19 MED ORDER — ALBUMIN HUMAN 5 % IV SOLN: INTRAVENOUS | Status: DC | PRN

## 2024-04-19 MED ORDER — MORPHINE SULFATE (PF) 0.5 MG/ML IJ SOLN
INTRAMUSCULAR | Status: AC
  Filled 2024-04-19: qty 10

## 2024-04-19 MED ORDER — DROPERIDOL 2.5 MG/ML IJ SOLN: 0.6250 mg | Freq: Once | INTRAMUSCULAR | Status: DC | PRN

## 2024-04-19 MED ORDER — TRANEXAMIC ACID-NACL 1000-0.7 MG/100ML-% IV SOLN
1000.0000 mg | Freq: Once | INTRAVENOUS | Status: AC
  Administered 2024-04-19: 1000 mg via INTRAVENOUS
  Filled 2024-04-19: qty 100

## 2024-04-19 MED ORDER — IBUPROFEN 600 MG PO TABS: 600.0000 mg | ORAL_TABLET | Freq: Four times a day (QID) | ORAL | Status: DC

## 2024-04-19 MED ORDER — FENTANYL CITRATE (PF) 100 MCG/2ML IJ SOLN: 25.0000 ug | INTRAMUSCULAR | Status: DC | PRN

## 2024-04-19 MED ORDER — HYDROMORPHONE HCL 1 MG/ML IJ SOLN: 0.5000 mg | INTRAMUSCULAR | Status: DC | PRN

## 2024-04-19 MED ORDER — ONDANSETRON HCL 4 MG/2ML IJ SOLN
INTRAMUSCULAR | Status: AC
  Filled 2024-04-19: qty 2

## 2024-04-19 MED ORDER — IBUPROFEN 600 MG PO TABS: 600.0000 mg | ORAL_TABLET | Freq: Four times a day (QID) | ORAL | 0 refills | Status: AC

## 2024-04-19 MED ORDER — SOD CITRATE-CITRIC ACID 500-334 MG/5ML PO SOLN
ORAL | Status: AC
  Filled 2024-04-19: qty 30

## 2024-04-19 MED ORDER — LACTATED RINGERS IV SOLN: INTRAVENOUS | Status: DC | PRN

## 2024-04-19 MED ORDER — TRANEXAMIC ACID-NACL 1000-0.7 MG/100ML-% IV SOLN
INTRAVENOUS | Status: DC | PRN
  Administered 2024-04-19: 1000 mg via INTRAVENOUS

## 2024-04-19 MED ORDER — OXYCODONE HCL 5 MG/5ML PO SOLN: 5.0000 mg | Freq: Once | ORAL | Status: DC | PRN

## 2024-04-19 SURGICAL SUPPLY — 14 items
CATH ROBINSON RED A/P 16FR (CATHETERS) ×1 IMPLANT
CONT PATH 16OZ SNAP LID 3702 (MISCELLANEOUS) ×1 IMPLANT
GLOVE BIOGEL PI IND STRL 7.0 (GLOVE) ×1 IMPLANT
GLOVE SURG SS PI 7.0 STRL IVOR (GLOVE) ×1 IMPLANT
GLOVE SURG UNDER POLY LF SZ7.5 (GLOVE) ×1 IMPLANT
GOWN STRL REUS W/ TWL LRG LVL3 (GOWN DISPOSABLE) ×1 IMPLANT
GOWN STRL REUS W/ TWL XL LVL3 (GOWN DISPOSABLE) ×1 IMPLANT
HIBICLENS CHG 4% 4OZ BTL (MISCELLANEOUS) ×1 IMPLANT
MAT PREVALON FULL STRYKER (MISCELLANEOUS) IMPLANT
NS IRRIG 1000ML POUR BTL (IV SOLUTION) ×1 IMPLANT
PACK VAGINAL MINOR WOMEN LF (CUSTOM PROCEDURE TRAY) ×1 IMPLANT
PAD OB MATERNITY 4.3X12.25 (PERSONAL CARE ITEMS) ×1 IMPLANT
PAD PREP 24X48 CUFFED NSTRL (MISCELLANEOUS) ×1 IMPLANT
TOWEL OR 17X24 6PK STRL BLUE (TOWEL DISPOSABLE) ×2 IMPLANT

## 2024-04-19 NOTE — Progress Notes (Addendum)
 I was called urgently to the patient's room at approximately 0030 due to patient just delivered and was bleeding. Patient admitted and managed by Dr. Erik for induction termination/medication abortion, with last cytotect placed by her at 2330  When I arrived, baby was on the bed and cord clamped and patient sitting in approximately of blood clot. I cut the cord as long as could and handed the baby, who was stillborn, off. Patient kept bleeding and speculum exam done and large amount of blood clots continued to form. Cervix tilted very posteriorly and cervix dilated to about 1.5 to 2cm and placenta did not come would not come out. Pieces of placenta were removed in pieces using the ringed forceps and this slowed the bleeding down. VS normal and stable and PPH called and CBC and DIC panel ordered along with 2U PRBCs. Bedside u/s done and still a large amount of placenta noted. Given this, difficult with exam and patient intolerance, I told her I recommend she have an urgent D&C, which she is amenable to.   Patient ate Zaxbys at 2100, but given degree of bleeding and exam, I recommend the OR. Admit Hgb 10.9  Total EBL  Bebe Izell Raddle MD Attending Center for [employer] (Faculty Practice) 04/19/2024 Time: [phone]

## 2024-04-19 NOTE — Anesthesia Procedure Notes (Signed)
 Procedure Name: Intubation Date/Time: 04/19/2024 1:23 AM  Performed by: Tilford Franky BIRCH, MDPre-anesthesia Checklist: Patient identified, Emergency Drugs available, Suction available and Patient being monitored Patient Re-evaluated:Patient Re-evaluated prior to induction Oxygen Delivery Method: Circle system utilized Preoxygenation: Pre-oxygenation with 100% oxygen Induction Type: IV induction Laryngoscope Size: Glidescope Grade View: Grade I Tube type: Oral Tube size: 7.0 mm Number of attempts: 1 Airway Equipment and Method: Rigid stylet Placement Confirmation: ETT inserted through vocal cords under direct vision, positive ETCO2 and breath sounds checked- equal and bilateral Secured at: 21 cm Tube secured with: Tape Dental Injury: Teeth and Oropharynx as per pre-operative assessment

## 2024-04-19 NOTE — Brief Op Note (Signed)
 04/19/2024  1:58 AM  PATIENT:  Dorothy Wallace  23 y.o. female  PRE-OPERATIVE DIAGNOSIS:  retained products  POST-OPERATIVE DIAGNOSIS:  retained products  PROCEDURE:  u/s guided uterine curettage  SURGEON:  Surgeons and Role:    DEWAINE Izell Harari, MD - Primary  ASSISTANTS: none   ANESTHESIA:   general  EBL:  600 mL and in her delivery room  BLOOD ADMINISTERED:none  ABx: doxycycline  200mg  IV IVF: 1L in the delivery room and in the OR  DRAINS: indwelling foley UOP clear   LOCAL MEDICATIONS USED:  NONE  SPECIMEN:  placenta  DISPOSITION OF SPECIMEN:  PATHOLOGY  COUNTS:  YES  TOURNIQUET:  * No tourniquets in log *  DICTATION: .Note written in EPIC  PLAN OF CARE: Admit for overnight observation  PATIENT DISPOSITION:  PACU - hemodynamically stable.   Delay start of Pharmacological VTE agent (>24hrs) due to surgical blood loss or risk of bleeding: not applicable  Labs not drawn during delivery. Lab called and drawn in the OR now.   Harari Izell Raddle MD Attending Center for [employer] (Faculty Practice) 04/19/2024 Time: 0200

## 2024-04-19 NOTE — Transfer of Care (Signed)
 Immediate Anesthesia Transfer of Care Note  Patient: Dorothy Wallace  Procedure(s) Performed: DILATION AND CURETTAGE (Bilateral)  Patient Location: PACU  Anesthesia Type:General  Level of Consciousness: awake, alert , and oriented  Airway & Oxygen Therapy: Patient Spontanous Breathing and Patient connected to nasal cannula oxygen  Post-op Assessment: Report given to RN and Post -op Vital signs reviewed and stable  Post vital signs: Reviewed and stable  Last Vitals:  Vitals Value Taken Time  BP 141/54 04/19/24 02:24  Temp    Pulse 94 04/19/24 02:28  Resp 17 04/19/24 02:28  SpO2 100 % 04/19/24 02:28  Vitals shown include unfiled device data.  Last Pain:  Vitals:   04/19/24 0005  TempSrc:   PainSc: 8       Patients Stated Pain Goal: 4 (04/18/24 1327)  Complications: No notable events documented.

## 2024-04-19 NOTE — Op Note (Signed)
 Operative Note   04/19/2024  PRE-OP DIAGNOSIS: retained products of conception after SVD at 15 weeks   POST-OP DIAGNOSIS: Same.   SURGEON: Surgeons and Role:    * Izell Harari, MD - Primary  ASSISTANT: None  PROCEDURE:  Ultrasound guided uterine curettage  ANESTHESIA: General  ESTIMATED BLOOD LOSS: 600 mL and in her delivery room   DRAINS: UOP via foley  TOTAL IV FLUIDS: 1L in the delivery room and in the OR   SPECIMENS: products of conception to pathology  VTE PROPHYLAXIS: SCDs to the bilateral lower extremities  ANTIBIOTICS: Doxycycline  200mg  IV x 1   COMPLICATIONS: none  DISPOSITION: PACU - hemodynamically stable.  CONDITION: stable  BLOOD TYPE: B POS. Rhogam given:not applicable  FINDINGS: moderate amount of POCs on curettage. Gritty texture in all four quadrants at the end of the case along with thin stripe on ultrasound  PROCEDURE IN DETAIL:  The patient was taken to the operating room where anesthesia was obtained without difficulty. The patient was positioned in the dorsal lithotomy position in Ashley stirrups. The patient was examined under anesthesia, with the above noted findings.  The deaver and weighted speculum were  placed inside the patient's vagina, and the the anterior lip of the cervix was seen and grasped with the ringed forceps. Using ultrasound, a gentle curettage was done and at the end and yielded no more products of conception.   Excellent hemostasis was noted, and all instruments were removed, with excellent hemostasis noted throughout.  She was then taken out of dorsal lithotomy. The patient tolerated the procedure well.  Sponge, lap and instrument counts were correct x2.  The patient was taken to recovery room in excellent condition.  Harari Izell Raddle MD Attending Center for [employer] [facility])

## 2024-04-19 NOTE — Progress Notes (Signed)
 Length of fetus: 6 inches Weight of fetus: 3.2 oz

## 2024-04-19 NOTE — Discharge Summary (Signed)
 Antenatal Physician Discharge Summary  Patient ID: Dorothy Wallace MRN: [id number] DOB/AGE: [birth year] 23 y.o.  Admit date: 04/18/2024 Discharge date: 04/19/2024  Admission Diagnoses: Previable PPROM at [redacted]w[redacted]d  Discharge Diagnoses:  Complete induced termination of pregnancy complicated by hemorrhage  Hospital Course:  Dorothy Wallace is a 23 y.o. G3P0030 with IUP at [redacted]w[redacted]d who was admitted for induction termination for previable PPROM c/b hemorrhage now POD0 s/p D&C for retained products of conception and ready for discharge home.   Presented with leakage of fluid, vaginal bleeding, and abdominal pain. Previable PPROM was confirmed on two separate examinations. FHR present. The patient was counseled on maternal and fetal risks of expectant management, medical or surgical pregnancy termination. After detailed counseling, she opted for medical termination. Waiting period was waived due to threat to maternal life. See documentation by myself from 04/18/24. She delivered a stillborn fetus on 04/19/24. Her delivery was then complicated by hemorrhage with total EBL . She underwent an uncomplicated D&C for retained products of conception and received 2u pRBCS. Post transfusion hemoglobin was 9.5. She did not have any signs/symptoms of anemia.   On day of discharge, she was ambulating without issues, tolerating a regular diet, voiding spontaneously, and pain was well controlled.   She will follow up with me in the office in 2-4 weeks.   Discharge Exam: Temp:  [97.5 F (36.4 C)-99.6 F (37.6 C)] 98.2 F (36.8 C) (11/01 1213) Pulse Rate:  [76-104] 88 (11/01 1213) Resp:  [9-19] 16 (11/01 1213) BP: (97-141)/(54-92) 116/59 (11/01 1213) SpO2:  [99 %-100 %] 100 % (11/01 1213) Physical Examination: CONSTITUTIONAL: Well-developed, well-nourished female in no acute distress.  SKIN: Skin is warm and dry. No rash noted. Not diaphoretic. No erythema. No pallor. CARDIOVASCULAR: Normal heart rate  noted RESPIRATORY: Effort normal, no problems with respiration noted ABDOMEN: Soft, nontender, nondistended  Significant Diagnostic Studies:  Results for orders placed or performed during the hospital encounter of 04/18/24 (from the past week)  Fern Test   Collection Time: 04/18/24  8:09 AM  Result Value Ref Range   POCT Fern Test Positive = ruptured amniotic membanes   CBC   Collection Time: 04/18/24  8:16 AM  Result Value Ref Range   WBC 9.9 4.0 - 10.5 K/uL   RBC 4.11 3.87 - 5.11 MIL/uL   Hemoglobin 10.9 (L) 12.0 - 15.0 g/dL   HCT 67.0 (L) 63.9 - 53.9 %   MCV 80.0 80.0 - 100.0 fL   MCH 26.5 26.0 - 34.0 pg   MCHC 33.1 30.0 - 36.0 g/dL   RDW 82.9 (H) 88.4 - 84.4 %   Platelets 222 150 - 400 K/uL   nRBC 0.0 0.0 - 0.2 %  Comprehensive metabolic panel   Collection Time: 04/18/24  8:16 AM  Result Value Ref Range   Sodium 133 (L) 135 - 145 mmol/L   Potassium 4.0 3.5 - 5.1 mmol/L   Chloride 103 98 - 111 mmol/L   CO2 18 (L) 22 - 32 mmol/L   Glucose, Bld 70 70 - 99 mg/dL   BUN 5 (L) 6 - 20 mg/dL   Creatinine, Ser 9.45 0.44 - 1.00 mg/dL   Calcium 9.1 8.9 - 89.6 mg/dL   Total Protein 7.8 6.5 - 8.1 g/dL   Albumin 3.5 3.5 - 5.0 g/dL   AST 31 15 - 41 U/L   ALT 16 0 - 44 U/L   Alkaline Phosphatase 64 38 - 126 U/L   Total Bilirubin 0.7 0.0 - 1.2 mg/dL  GFR, Estimated >60 >60 mL/min   Anion gap 12 5 - 15  Type and screen [facility]   Collection Time: 04/18/24  8:40 AM  Result Value Ref Range   ABO/RH(D) B POS    Antibody Screen NEG    Sample Expiration 04/21/2024,2359    Unit Number [id number]    Blood Component Type RED CELLS,LR    Unit division 00    Status of Unit ISSUED    Transfusion Status OK TO TRANSFUSE    Crossmatch Result      Compatible Performed at [facility], [address], [place], [place] [address]    Unit Number [id number]    Blood Component Type RED CELLS,LR    Unit division 00    Status of Unit ISSUED    Transfusion  Status OK TO TRANSFUSE    Crossmatch Result Compatible   BPAM RBC   Collection Time: 04/18/24  8:40 AM  Result Value Ref Range   ISSUE DATE / TIME 797488989750    Blood Product Unit Number [id number]    PRODUCT CODE [id number]    Unit Type and Rh 7300    Blood Product Expiration Date 202511252359    ISSUE DATE / TIME 797488989750    Blood Product Unit Number [id number]    PRODUCT CODE [id number]    Unit Type and Rh 7300    Blood Product Expiration Date 797488737640   Initiate postpartum hemorrhage - BB notification   Collection Time: 04/19/24  1:10 AM  Result Value Ref Range   Initiate PPH protocol (BB notified)      PPH ORDER RECEIVED Performed at [facility], [address] [place], [place] [address]   Prepare RBC (crossmatch)   Collection Time: 04/19/24  1:12 AM  Result Value Ref Range   Order Confirmation      ORDER PROCESSED BY BLOOD BANK Performed at [facility], [address] [place], [place] [address]   Comprehensive metabolic panel   Collection Time: 04/19/24  2:03 AM  Result Value Ref Range   Sodium 131 (L) 135 - 145 mmol/L   Potassium 3.6 3.5 - 5.1 mmol/L   Chloride 107 98 - 111 mmol/L   CO2 17 (L) 22 - 32 mmol/L   Glucose, Bld 104 (H) 70 - 99 mg/dL   BUN <5 (L) 6 - 20 mg/dL   Creatinine, Ser 9.56 (L) 0.44 - 1.00 mg/dL   Calcium 7.1 (L) 8.9 - 10.3 mg/dL   Total Protein 4.2 (L) 6.5 - 8.1 g/dL   Albumin 2.0 (L) 3.5 - 5.0 g/dL   AST 10 (L) 15 - 41 U/L   ALT 7 0 - 44 U/L   Alkaline Phosphatase 29 (L) 38 - 126 U/L   Total Bilirubin 0.4 0.0 - 1.2 mg/dL   GFR, Estimated >39 >39 mL/min   Anion gap 7 5 - 15  Protime-INR   Collection Time: 04/19/24  2:03 AM  Result Value Ref Range   Prothrombin Time 16.7 (H) 11.4 - 15.2 seconds   INR 1.3 (H) 0.8 - 1.2  APTT   Collection Time: 04/19/24  2:03 AM  Result Value Ref Range   aPTT 37 (H) 24 - 36 seconds  Fibrinogen   Collection Time: 04/19/24  2:03 AM  Result Value Ref Range   Fibrinogen 352  210 - 475 mg/dL  CBC   Collection Time: 04/19/24  2:03 AM  Result Value Ref Range   WBC 9.4 4.0 -  10.5 K/uL   RBC 2.36 (L) 3.87 - 5.11 MIL/uL   Hemoglobin 6.1 (LL) 12.0 - 15.0 g/dL   HCT 81.1 (L) 63.9 - 53.9 %   MCV 79.7 (L) 80.0 - 100.0 fL   MCH 25.8 (L) 26.0 - 34.0 pg   MCHC 32.4 30.0 - 36.0 g/dL   RDW 82.7 (H) 88.4 - 84.4 %   Platelets 142 (L) 150 - 400 K/uL   nRBC 0.0 0.0 - 0.2 %  CBC   Collection Time: 04/19/24 10:07 AM  Result Value Ref Range   WBC 14.8 (H) 4.0 - 10.5 K/uL   RBC 3.55 (L) 3.87 - 5.11 MIL/uL   Hemoglobin 9.5 (L) 12.0 - 15.0 g/dL   HCT 71.7 (L) 63.9 - 53.9 %   MCV 79.4 (L) 80.0 - 100.0 fL   MCH 26.8 26.0 - 34.0 pg   MCHC 33.7 30.0 - 36.0 g/dL   RDW 83.7 (H) 88.4 - 84.4 %   Platelets 187 150 - 400 K/uL   nRBC 0.0 0.0 - 0.2 %  Results for orders placed or performed during the hospital encounter of 04/17/24 (from the past week)  GC/Chlamydia probe amp (Maish Vaya)not at [facility]   Collection Time: 04/17/24  7:01 AM  Result Value Ref Range   Neisseria Gonorrhea Negative    Chlamydia Negative    Comment Normal Reference Ranger Chlamydia - Negative    Comment      Normal Reference Range Neisseria Gonorrhea - Negative  Wet prep, genital   Collection Time: 04/17/24  7:03 AM  Result Value Ref Range   Yeast Wet Prep HPF POC NONE SEEN NONE SEEN   Trich, Wet Prep NONE SEEN NONE SEEN   Clue Cells Wet Prep HPF POC NONE SEEN NONE SEEN   WBC, Wet Prep HPF POC >=10 (A) <10   Sperm NONE SEEN   Urinalysis, Routine w reflex microscopic -Urine, Clean Catch   Collection Time: 04/17/24  7:13 AM  Result Value Ref Range   Color, Urine YELLOW YELLOW   APPearance HAZY (A) CLEAR   Specific Gravity, Urine 1.026 1.005 - 1.030   pH 6.0 5.0 - 8.0   Glucose, UA NEGATIVE NEGATIVE mg/dL   Hgb urine dipstick NEGATIVE NEGATIVE   Bilirubin Urine NEGATIVE NEGATIVE   Ketones, ur NEGATIVE NEGATIVE mg/dL   Protein, ur NEGATIVE NEGATIVE mg/dL   Nitrite NEGATIVE NEGATIVE   Leukocytes,Ua  MODERATE (A) NEGATIVE   RBC / HPF 0-5 0 - 5 RBC/hpf   WBC, UA 6-10 0 - 5 WBC/hpf   Bacteria, UA RARE (A) NONE SEEN   Squamous Epithelial / HPF 0-5 0 - 5 /HPF   Mucus PRESENT    US  MFM OB LIMITED Result Date: 04/19/2024 ----------------------------------------------------------------------  OBSTETRICS REPORT                        (Signed Final 04/19/2024 10:16 am) ---------------------------------------------------------------------- Patient Info  ID #:       [id number]                          D.O.B.:  04-Feb-2001 (23 yrs)(F)  Name:       Dorothy Wallace                    Visit Date: 04/18/2024 08:56 am ---------------------------------------------------------------------- Performed By  Attending:        Steffan Keys MD  Secondary Phy.:    LISA COOLEEN NP  Performed By:     Emelia Coombs BS,       Address:           [facility]                    RDMS, RVT  Referred By:      [facility] MAU/Triage         [facility] ---------------------------------------------------------------------- Orders  #  Description                           Code        Ordered By  1  US  MFM OB LIMITED                     76815.01    LISA COOLEEN ----------------------------------------------------------------------  #  Order #                     Accession #                Episode #  1  [id number]                   [id number]                 [id number] ---------------------------------------------------------------------- Indications  Premature rupture of membranes - leaking        O42.90  fluid  Pelvic pain affecting pregnancy in second       O26.892  trimester  [redacted] weeks gestation of pregnancy                 Z3A.15 ---------------------------------------------------------------------- Fetal Evaluation  Num Of Fetuses:          1  Fetal Heart Rate(bpm):   161  Cardiac Activity:        Observed  Presentation:            Breech  Placenta:                 Anterior  P. Cord Insertion:       Not well visualized  Amniotic Fluid  AFI FV:      Subjectively low-normal                              Largest Pocket(cm)                              2.7 ---------------------------------------------------------------------- Biometry  BPD:      32.2  mm     G. Age:  16w 0d         75  %    CI:        79.81   %    70 - 86  FL/HC:       15.7  %    15.3 - 17.1  HC:      113.9  mm     G. Age:  15w 4d         40  %    HC/AC:       1.12       1.05 - 1.39  AC:      102.1  mm     G. Age:  16w 1d         78  %    FL/BPD:      55.6  %  FL:       17.9  mm     G. Age:  15w 2d         40  %    FL/AC:       17.5  %    20 - 24  Est. FW:     134   gm     0 lb 5 oz     63  % ---------------------------------------------------------------------- OB History  Gravidity:    3          SAB:   2 ---------------------------------------------------------------------- Gestational Age  LMP:           15w 3d        Date:  01/01/24                  EDD:   10/07/24  U/S Today:     15w 5d                                        EDD:   10/05/24  Best:          15w 3d     Det. By:  LMP  (01/01/24)          EDD:   10/07/24 ---------------------------------------------------------------------- Anatomy  Cranium:               Visualized             Stomach:                Visualized  Choroid Plexus:        Visualized             Cord Vessels:           Visualized  Heart:                 Visualized             Bladder:                Visualized  Other:  Technically difficult due to early gestational age and fetal position. ---------------------------------------------------------------------- Cervix Uterus Adnexa  Cervix  Length:            2.5  cm.  Appears funnelled, see comments  Uterus  No abnormality visualized.  Right Ovary  Not visualized.  Left Ovary  Not visualized.  Cul De Sac  No free fluid seen.  Adnexa  No abnormality visualized  ---------------------------------------------------------------------- Comments  This patient presented to the MAU due to leakage of fluid and  vaginal bleeding.  A limited ultrasound performed today showed low normal  amniotic fluid.  The maximum vertical pocket was 2.7 cm.  The fetus was in the breech presentation.  An anterior placenta was noted.  The fetal biometry measurements performed today showed  an EFW of 134 g which is consistent with 15 weeks and 3  days. ----------------------------------------------------------------------                   Steffan Keys, MD Electronically Signed Final Report   04/19/2024 10:16 am ----------------------------------------------------------------------    No future appointments.  Discharge Condition: Stable  Discharge disposition: 01-Home or Self Care       Discharge Instructions     Activity as tolerated - No restrictions   Complete by: As directed    Call MD for:  persistant nausea and vomiting   Complete by: As directed    Call MD for:  severe uncontrolled pain   Complete by: As directed    Call MD for:  temperature >100.4   Complete by: As directed    Diet general   Complete by: As directed    No wound care   Complete by: As directed    Sexual Activity Restrictions   Complete by: As directed    Avoid sexual activity until cleared by provider      Allergies as of 04/19/2024       Reactions   Bee Pollen Other (See Comments)   Cough; itchy dry eyes; sneezing, stuffy nose Cough; itchy dry eyes; sneezing, stuffy nose   Gramineae Pollens    Cough; itchy dry eyes; sneezing, stuffy nose   Pollen Extract Other (See Comments)   Cough; itchy dry eyes; sneezing, stuffy nose Cough; itchy dry eyes; sneezing, stuffy nose        Medication List     STOP taking these medications    Vitafol  Ultra 29-0.6-0.4-200 MG Caps       TAKE these medications    ferrous sulfate 325 (65 FE) MG tablet Take 1 tablet (325 mg total) by mouth  every other day.   ibuprofen 600 MG tablet Commonly known as: ADVIL Take 1 tablet (600 mg total) by mouth every 6 (six) hours.        Follow-up Information     [facility] for [facility] Follow up in 2 week(s).   Specialty: Obstetrics and Gynecology Why: Please follow up with your Ob/Gyn or Dr. Erik within 2-4 weeks Contact information: [address] [place]  [address] [phone]                Total discharge time: 30 minutes   Signed: Kieth JAYSON Erik M.D. 04/19/2024, 12:56 PM

## 2024-04-19 NOTE — Progress Notes (Signed)
 Pt called out at 0025 with increased abd pain/cramping. This RN at bedside at [facility]. Non-viable female fetus delivered at 43. Dr. Izell called to bedside at 0031. Dr. Izell cut cord upon arrival. Dr. Izell attempted to removal placenta. Postpartum hemorrhage called out 0056. Pt continued to have excess vaginal bleeding. Dr. Izell educated pt on need for [facility]. D&C decision made at 0100. Surgical prep completed at 0106. EBL at this time .

## 2024-04-19 NOTE — Anesthesia Postprocedure Evaluation (Signed)
 Anesthesia Post Note  Patient: Dorothy Wallace  Procedure(s) Performed: DILATION AND CURETTAGE (Bilateral)     Patient location during evaluation: PACU Anesthesia Type: General Level of consciousness: awake and alert Pain management: pain level controlled Vital Signs Assessment: post-procedure vital signs reviewed and stable Respiratory status: spontaneous breathing, nonlabored ventilation, respiratory function stable and patient connected to nasal cannula oxygen Cardiovascular status: blood pressure returned to baseline and stable Postop Assessment: no apparent nausea or vomiting Anesthetic complications: no   No notable events documented.  Last Vitals:  Vitals:   04/19/24 0533 04/19/24 0557  BP: 114/66 120/70  Pulse: 96 90  Resp: 16 17  Temp: 37 C 36.6 C  SpO2: 100% 100%    Last Pain:  Vitals:   04/19/24 0557  TempSrc: Oral  PainSc:    Pain Goal: Patients Stated Pain Goal: 4 (04/18/24 1327)                 [name]

## 2024-04-19 NOTE — Anesthesia Preprocedure Evaluation (Signed)
 Anesthesia Evaluation  Patient identified by MRN, date of birth, ID band Patient awake    Reviewed: Allergy & Precautions, Patient's Chart, lab work & pertinent test results  Airway Mallampati: II  TM Distance: >3 FB Neck ROM: Full    Dental  (+) Teeth Intact, Dental Advisory Given   Pulmonary neg pulmonary ROS   breath sounds clear to auscultation       Cardiovascular negative cardio ROS  Rhythm:Regular Rate:Tachycardia     Neuro/Psych  PSYCHIATRIC DISORDERS  Depression    negative neurological ROS     GI/Hepatic negative GI ROS,,,  Endo/Other    Renal/GU      Musculoskeletal   Abdominal   Peds  Hematology   Anesthesia Other Findings 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  Reproductive/Obstetrics                              Anesthesia Physical Anesthesia Plan  ASA: 2 and emergent  Anesthesia Plan: General   Post-op Pain Management: Ofirmev IV (intra-op)*   Induction: Intravenous, Rapid sequence and Cricoid pressure planned  PONV Risk Score and Plan: 4 or greater and Ondansetron, Dexamethasone , Midazolam, Scopolamine patch - Pre-op and Treatment may vary due to age or medical condition  Airway Management Planned: Oral ETT  Additional Equipment: None  Intra-op Plan:   Post-operative Plan: Extubation in OR  Informed Consent: I have reviewed the patients History and Physical, chart, labs and discussed the procedure including the risks, benefits and alternatives for the proposed anesthesia with the patient or authorized representative who has indicated his/her understanding and acceptance.     Dental advisory given  Plan Discussed with: CRNA  Anesthesia Plan Comments:          Anesthesia Quick Evaluation

## 2024-04-20 LAB — TYPE AND SCREEN
ABO/RH(D): B POS
Antibody Screen: NEGATIVE
Unit division: 0
Unit division: 0

## 2024-04-20 LAB — BPAM RBC
Blood Product Expiration Date: 202511252359
Blood Product Expiration Date: 202511262359
ISSUE DATE / TIME: 202511010249
ISSUE DATE / TIME: 202511010249
Unit Type and Rh: 7300
Unit Type and Rh: 7300

## 2024-04-23 LAB — SURGICAL PATHOLOGY

## 2024-04-24 ENCOUNTER — Ambulatory Visit: Payer: Self-pay | Admitting: Obstetrics and Gynecology

## 2024-05-06 ENCOUNTER — Encounter: Admitting: Obstetrics and Gynecology
